# Patient Record
Sex: Female | Born: 1940 | Race: Black or African American | Hispanic: No | Marital: Single | Smoking: Former smoker
Health system: Southern US, Community
[De-identification: ages and names within clinical notes are randomized; demographics above are authoritative.]

## PROBLEM LIST (undated history)

## (undated) DIAGNOSIS — R51 Headache: Secondary | ICD-10-CM

## (undated) DIAGNOSIS — D649 Anemia, unspecified: Secondary | ICD-10-CM

## (undated) DIAGNOSIS — F419 Anxiety disorder, unspecified: Secondary | ICD-10-CM

## (undated) DIAGNOSIS — R519 Headache, unspecified: Secondary | ICD-10-CM

## (undated) DIAGNOSIS — R252 Cramp and spasm: Secondary | ICD-10-CM

## (undated) DIAGNOSIS — K2210 Ulcer of esophagus without bleeding: Secondary | ICD-10-CM

## (undated) DIAGNOSIS — I10 Essential (primary) hypertension: Secondary | ICD-10-CM

## (undated) DIAGNOSIS — E0590 Thyrotoxicosis, unspecified without thyrotoxic crisis or storm: Secondary | ICD-10-CM

## (undated) DIAGNOSIS — N281 Cyst of kidney, acquired: Secondary | ICD-10-CM

## (undated) DIAGNOSIS — M1990 Unspecified osteoarthritis, unspecified site: Secondary | ICD-10-CM

## (undated) DIAGNOSIS — K5790 Diverticulosis of intestine, part unspecified, without perforation or abscess without bleeding: Secondary | ICD-10-CM

## (undated) DIAGNOSIS — R0600 Dyspnea, unspecified: Secondary | ICD-10-CM

## (undated) DIAGNOSIS — T8859XA Other complications of anesthesia, initial encounter: Secondary | ICD-10-CM

## (undated) DIAGNOSIS — K219 Gastro-esophageal reflux disease without esophagitis: Secondary | ICD-10-CM

## (undated) DIAGNOSIS — E079 Disorder of thyroid, unspecified: Secondary | ICD-10-CM

## (undated) DIAGNOSIS — J189 Pneumonia, unspecified organism: Secondary | ICD-10-CM

## (undated) DIAGNOSIS — R011 Cardiac murmur, unspecified: Secondary | ICD-10-CM

## (undated) DIAGNOSIS — E039 Hypothyroidism, unspecified: Secondary | ICD-10-CM

## (undated) DIAGNOSIS — I493 Ventricular premature depolarization: Secondary | ICD-10-CM

## (undated) DIAGNOSIS — J479 Bronchiectasis, uncomplicated: Secondary | ICD-10-CM

## (undated) DIAGNOSIS — T4145XA Adverse effect of unspecified anesthetic, initial encounter: Secondary | ICD-10-CM

## (undated) DIAGNOSIS — I341 Nonrheumatic mitral (valve) prolapse: Secondary | ICD-10-CM

## (undated) DIAGNOSIS — Z8719 Personal history of other diseases of the digestive system: Secondary | ICD-10-CM

## (undated) HISTORY — PX: COLONOSCOPY WITH ESOPHAGOGASTRODUODENOSCOPY (EGD): SHX5779

## (undated) HISTORY — PX: BUNIONECTOMY: SHX129

## (undated) HISTORY — PX: APPENDECTOMY: SHX54

## (undated) HISTORY — PX: LAPAROSCOPY FOR ECTOPIC PREGNANCY: SUR765

## (undated) HISTORY — PX: ABDOMINAL HYSTERECTOMY: SHX81

## (undated) HISTORY — PX: EYE SURGERY: SHX253

---

## 1997-11-29 ENCOUNTER — Ambulatory Visit (HOSPITAL_COMMUNITY): Admission: RE | Admit: 1997-11-29 | Discharge: 1997-11-29 | Payer: Self-pay | Admitting: *Deleted

## 1999-05-24 ENCOUNTER — Encounter: Payer: Self-pay | Admitting: *Deleted

## 1999-05-24 ENCOUNTER — Ambulatory Visit (HOSPITAL_COMMUNITY): Admission: RE | Admit: 1999-05-24 | Discharge: 1999-05-24 | Payer: Self-pay | Admitting: *Deleted

## 1999-11-20 ENCOUNTER — Other Ambulatory Visit: Admission: RE | Admit: 1999-11-20 | Discharge: 1999-11-20 | Payer: Self-pay | Admitting: Urology

## 1999-12-09 ENCOUNTER — Encounter: Admission: RE | Admit: 1999-12-09 | Discharge: 1999-12-09 | Payer: Self-pay | Admitting: Urology

## 1999-12-09 ENCOUNTER — Encounter: Payer: Self-pay | Admitting: Urology

## 1999-12-13 ENCOUNTER — Encounter: Admission: RE | Admit: 1999-12-13 | Discharge: 1999-12-13 | Payer: Self-pay | Admitting: Urology

## 1999-12-13 ENCOUNTER — Encounter: Payer: Self-pay | Admitting: Urology

## 2000-03-13 ENCOUNTER — Encounter (INDEPENDENT_AMBULATORY_CARE_PROVIDER_SITE_OTHER): Payer: Self-pay | Admitting: Specialist

## 2000-03-13 ENCOUNTER — Ambulatory Visit (HOSPITAL_COMMUNITY): Admission: RE | Admit: 2000-03-13 | Discharge: 2000-03-13 | Payer: Self-pay | Admitting: Gastroenterology

## 2000-03-18 ENCOUNTER — Other Ambulatory Visit: Admission: RE | Admit: 2000-03-18 | Discharge: 2000-03-18 | Payer: Self-pay | Admitting: Urology

## 2000-05-06 ENCOUNTER — Encounter: Admission: RE | Admit: 2000-05-06 | Discharge: 2000-05-06 | Payer: Self-pay | Admitting: Internal Medicine

## 2000-05-06 ENCOUNTER — Encounter: Payer: Self-pay | Admitting: Internal Medicine

## 2000-05-18 ENCOUNTER — Encounter: Payer: Self-pay | Admitting: Internal Medicine

## 2000-05-18 ENCOUNTER — Encounter: Admission: RE | Admit: 2000-05-18 | Discharge: 2000-05-18 | Payer: Self-pay | Admitting: Internal Medicine

## 2000-06-03 ENCOUNTER — Encounter: Payer: Self-pay | Admitting: Internal Medicine

## 2000-06-03 ENCOUNTER — Encounter: Admission: RE | Admit: 2000-06-03 | Discharge: 2000-06-03 | Payer: Self-pay | Admitting: Internal Medicine

## 2001-04-02 ENCOUNTER — Other Ambulatory Visit: Admission: RE | Admit: 2001-04-02 | Discharge: 2001-04-02 | Payer: Self-pay | Admitting: Obstetrics and Gynecology

## 2001-04-05 ENCOUNTER — Encounter: Admission: RE | Admit: 2001-04-05 | Discharge: 2001-04-05 | Payer: Self-pay | Admitting: Obstetrics and Gynecology

## 2001-04-05 ENCOUNTER — Encounter: Payer: Self-pay | Admitting: Obstetrics and Gynecology

## 2001-06-08 ENCOUNTER — Encounter: Admission: RE | Admit: 2001-06-08 | Discharge: 2001-06-08 | Payer: Self-pay | Admitting: Obstetrics and Gynecology

## 2001-06-08 ENCOUNTER — Encounter: Payer: Self-pay | Admitting: Obstetrics and Gynecology

## 2002-06-10 ENCOUNTER — Ambulatory Visit (HOSPITAL_COMMUNITY): Admission: RE | Admit: 2002-06-10 | Discharge: 2002-06-10 | Payer: Self-pay | Admitting: Obstetrics and Gynecology

## 2002-06-10 ENCOUNTER — Encounter: Payer: Self-pay | Admitting: Obstetrics and Gynecology

## 2002-06-24 ENCOUNTER — Other Ambulatory Visit: Admission: RE | Admit: 2002-06-24 | Discharge: 2002-06-24 | Payer: Self-pay | Admitting: Obstetrics and Gynecology

## 2003-04-07 ENCOUNTER — Encounter: Admission: RE | Admit: 2003-04-07 | Discharge: 2003-04-07 | Payer: Self-pay | Admitting: Internal Medicine

## 2003-06-19 ENCOUNTER — Ambulatory Visit (HOSPITAL_COMMUNITY): Admission: RE | Admit: 2003-06-19 | Discharge: 2003-06-19 | Payer: Self-pay | Admitting: Obstetrics and Gynecology

## 2003-09-25 ENCOUNTER — Other Ambulatory Visit: Admission: RE | Admit: 2003-09-25 | Discharge: 2003-09-25 | Payer: Self-pay | Admitting: Obstetrics and Gynecology

## 2003-10-11 ENCOUNTER — Encounter: Admission: RE | Admit: 2003-10-11 | Discharge: 2003-10-11 | Payer: Self-pay | Admitting: Obstetrics and Gynecology

## 2004-06-25 ENCOUNTER — Ambulatory Visit (HOSPITAL_COMMUNITY): Admission: RE | Admit: 2004-06-25 | Discharge: 2004-06-25 | Payer: Self-pay | Admitting: Obstetrics and Gynecology

## 2004-10-23 ENCOUNTER — Ambulatory Visit: Admission: RE | Admit: 2004-10-23 | Discharge: 2004-10-23 | Payer: Self-pay | Admitting: Gynecology

## 2005-07-07 ENCOUNTER — Ambulatory Visit (HOSPITAL_COMMUNITY): Admission: RE | Admit: 2005-07-07 | Discharge: 2005-07-07 | Payer: Self-pay | Admitting: Obstetrics and Gynecology

## 2005-10-16 ENCOUNTER — Encounter: Admission: RE | Admit: 2005-10-16 | Discharge: 2005-10-16 | Payer: Self-pay | Admitting: Obstetrics and Gynecology

## 2006-07-08 ENCOUNTER — Ambulatory Visit (HOSPITAL_COMMUNITY): Admission: RE | Admit: 2006-07-08 | Discharge: 2006-07-08 | Payer: Self-pay | Admitting: Obstetrics and Gynecology

## 2006-12-09 ENCOUNTER — Emergency Department (HOSPITAL_COMMUNITY): Admission: EM | Admit: 2006-12-09 | Discharge: 2006-12-09 | Payer: Self-pay | Admitting: Family Medicine

## 2006-12-15 ENCOUNTER — Encounter: Admission: RE | Admit: 2006-12-15 | Discharge: 2006-12-15 | Payer: Self-pay | Admitting: Internal Medicine

## 2007-07-19 ENCOUNTER — Ambulatory Visit (HOSPITAL_COMMUNITY): Admission: RE | Admit: 2007-07-19 | Discharge: 2007-07-19 | Payer: Self-pay | Admitting: Obstetrics and Gynecology

## 2007-09-08 ENCOUNTER — Encounter: Admission: RE | Admit: 2007-09-08 | Discharge: 2007-09-08 | Payer: Self-pay | Admitting: Internal Medicine

## 2007-12-23 ENCOUNTER — Encounter: Admission: RE | Admit: 2007-12-23 | Discharge: 2007-12-23 | Payer: Self-pay | Admitting: Family Medicine

## 2008-07-19 ENCOUNTER — Encounter: Admission: RE | Admit: 2008-07-19 | Discharge: 2008-07-19 | Payer: Self-pay | Admitting: Obstetrics and Gynecology

## 2009-01-05 ENCOUNTER — Ambulatory Visit: Admission: RE | Admit: 2009-01-05 | Discharge: 2009-01-05 | Payer: Self-pay | Admitting: Gynecology

## 2009-01-09 ENCOUNTER — Encounter: Payer: Self-pay | Admitting: Pulmonary Disease

## 2009-01-09 ENCOUNTER — Ambulatory Visit (HOSPITAL_COMMUNITY): Admission: RE | Admit: 2009-01-09 | Discharge: 2009-01-09 | Payer: Self-pay | Admitting: Gynecology

## 2009-01-26 ENCOUNTER — Ambulatory Visit: Payer: Self-pay | Admitting: Pulmonary Disease

## 2009-01-26 DIAGNOSIS — I10 Essential (primary) hypertension: Secondary | ICD-10-CM | POA: Insufficient documentation

## 2009-01-26 DIAGNOSIS — J984 Other disorders of lung: Secondary | ICD-10-CM | POA: Insufficient documentation

## 2009-01-26 DIAGNOSIS — I059 Rheumatic mitral valve disease, unspecified: Secondary | ICD-10-CM | POA: Insufficient documentation

## 2009-01-26 DIAGNOSIS — J309 Allergic rhinitis, unspecified: Secondary | ICD-10-CM | POA: Insufficient documentation

## 2009-01-26 DIAGNOSIS — E039 Hypothyroidism, unspecified: Secondary | ICD-10-CM | POA: Insufficient documentation

## 2009-01-26 DIAGNOSIS — E0500 Thyrotoxicosis with diffuse goiter without thyrotoxic crisis or storm: Secondary | ICD-10-CM | POA: Insufficient documentation

## 2009-01-26 DIAGNOSIS — R011 Cardiac murmur, unspecified: Secondary | ICD-10-CM | POA: Insufficient documentation

## 2009-02-11 ENCOUNTER — Ambulatory Visit (HOSPITAL_COMMUNITY): Admission: RE | Admit: 2009-02-11 | Discharge: 2009-02-11 | Payer: Self-pay | Admitting: Urology

## 2009-06-04 ENCOUNTER — Telehealth: Payer: Self-pay | Admitting: Pulmonary Disease

## 2009-06-07 ENCOUNTER — Telehealth: Payer: Self-pay | Admitting: Pulmonary Disease

## 2009-06-12 ENCOUNTER — Ambulatory Visit (HOSPITAL_COMMUNITY)
Admission: RE | Admit: 2009-06-12 | Discharge: 2009-06-12 | Payer: Self-pay | Source: Home / Self Care | Admitting: Urology

## 2009-06-13 ENCOUNTER — Ambulatory Visit: Payer: Self-pay | Admitting: Cardiovascular Disease

## 2009-07-23 ENCOUNTER — Ambulatory Visit (HOSPITAL_COMMUNITY): Admission: RE | Admit: 2009-07-23 | Discharge: 2009-07-23 | Payer: Self-pay | Admitting: Obstetrics and Gynecology

## 2009-12-14 ENCOUNTER — Ambulatory Visit (HOSPITAL_COMMUNITY): Admission: RE | Admit: 2009-12-14 | Discharge: 2009-12-14 | Payer: Self-pay | Admitting: Urology

## 2010-07-01 ENCOUNTER — Encounter: Payer: Self-pay | Admitting: Urology

## 2010-07-01 ENCOUNTER — Encounter: Payer: Self-pay | Admitting: Gynecology

## 2010-07-03 ENCOUNTER — Encounter: Payer: Self-pay | Admitting: Pulmonary Disease

## 2010-07-04 ENCOUNTER — Other Ambulatory Visit (HOSPITAL_COMMUNITY): Payer: Self-pay | Admitting: Obstetrics and Gynecology

## 2010-07-04 DIAGNOSIS — Z1231 Encounter for screening mammogram for malignant neoplasm of breast: Secondary | ICD-10-CM

## 2010-07-04 DIAGNOSIS — Z139 Encounter for screening, unspecified: Secondary | ICD-10-CM

## 2010-07-09 ENCOUNTER — Ambulatory Visit: Payer: Self-pay | Admitting: Cardiology

## 2010-07-11 NOTE — Miscellaneous (Signed)
Summary: Orders Update   Clinical Lists Changes  Orders: Added new Referral order of Radiology Referral (Radiology) - Signed 

## 2010-07-24 ENCOUNTER — Ambulatory Visit (HOSPITAL_COMMUNITY): Admission: RE | Admit: 2010-07-24 | Payer: Medicare Other | Source: Ambulatory Visit

## 2010-08-25 LAB — CREATININE, SERUM
Creatinine, Ser: 0.82 mg/dL (ref 0.4–1.2)
GFR calc non Af Amer: >60 mL/min (ref >60)

## 2010-09-05 ENCOUNTER — Ambulatory Visit (HOSPITAL_COMMUNITY)
Admission: RE | Admit: 2010-09-05 | Discharge: 2010-09-05 | Disposition: A | Discharge status: Home / Self Care | Payer: Medicare Other | Source: Ambulatory Visit | Attending: Obstetrics and Gynecology | Admitting: Obstetrics and Gynecology

## 2010-09-05 DIAGNOSIS — Z1231 Encounter for screening mammogram for malignant neoplasm of breast: Secondary | ICD-10-CM

## 2010-09-15 LAB — BUN: BUN: 25 mg/dL — ABNORMAL HIGH (ref 6–23)

## 2010-10-22 NOTE — Consult Note (Signed)
Amanda Bender, KERPER NO.:  [id number]   MEDICAL RECORD NO.:  [id number]          PATIENT TYPE:  OUT   LOCATION:  GYN                          FACILITY:  [facility]   PHYSICIAN:  De Blanch, M.D.DATE OF BIRTH:  04-04-1941   DATE OF CONSULTATION:  DATE OF DISCHARGE:                                 CONSULTATION   REFERRING PHYSICIAN:  Maxie Better, M.D.   CHIEF COMPLAINT:  Rising CA-125.   HISTORY OF PRESENT ILLNESS:  A 70 year old [place] American female seen  in consultation at the request Dr. Cherly Hensen regarding a rising CA-125  value.  I initially saw the patient in May 2006 at which time she had a  small ovarian or paraovarian cyst and a great-grandmother with ovarian  cancer.  Concern regarding this cyst at that time was as to whether it  might be a malignancy.  I recommended the patient have a salpingo-  oophorectomy laparoscopically, but apparently that was not performed.  However, subsequently the patient has had CA-125 values performed in  monitoring.  Following values are available to me today.   May 2007 13 units mL; March 2008 15 units mL; May 2009 26.1 units ml;  August 2009 19 units mL May 2010 31 units mL.   The patient has had an ultrasound of the right ovary on Oct 19, 2008  which showed a small cystic area in the upper aspect the ovary measuring  0.8 x 1x 0.8 cm and there is some calcification.  This is essentially  unchanged from 4 years ago.   From a gynecologic point of view the patient has previously had a  hysterectomy and left salpingo-oophorectomy.   In considering other possibilities of a rising CA-125 we noted that  inflammatory disease can result in a falsely elevated CA-125.  The  patient then tells me that 2 years ago she had an onset of chills and  right ears and a rash which ultimately involved both legs and a fever  103 degrees well and associated ankle swelling.  She apparently  developed an abscess around her  knee which had to be drained and was a  staph infection.   Again this year the patient had a similar set of circumstances arise,  this time starting in the right leg but ultimately involving both legs.  She had sloughing of skin and required aspiration of her right ankle.  She was treated with Keflex at that time.   The patient's other symptoms are that she has right abdominal pain with  an onset of approximately a month ago.  This can be severe but is  intermittent.  She also has some low back pain.  Finally, she has had  bouts of sinusitis and has developed irregular bowel movements over the  past several months where previously she was very regular having 1 bowel  movement today.  She denies any blood in the stool.   PAST MEDICAL HISTORY:  Medical illnesses, hypertension, hypothyroidism  (Graves disease treated with iodine 125).   PAST SURGICAL HISTORY:  A hysterectomy and left salpingo-oophorectomy,  ectopic pregnancy, bunionectomy, ankle surgery.   FAMILY HISTORY:  The patient's great-grandmother had  ovarian cancer.  She has a number of men on the maternal side of the family with prostate  cancer.  It is also noted the patient's 12 year old daughter developed a  fulminant hepatic failure of unknown etiology and succumbed from that  disease.   CURRENT MEDICATIONS:  Synthroid, Avapro, Toprol, and Fosamax.  The  patient takes baby aspirin on occasion.   OBSTETRICAL HISTORY:  Gravida 2.   DRUG ALLERGIES:  SULFA (rash).   REVIEW OF SYSTEMS:  Negative except for symptoms noted above.   PHYSICAL EXAM:  VITAL SIGNS:  Weight 177 pounds, blood pressure 130/80.  GENERAL:  The patient is a healthy African American female in no acute  distress.  HEENT:  Negative.  NECK:  Supple without thyromegaly.  There is no supraclavicular or  inguinal adenopathy.  MUSCULOSKELETAL:  There is no CVA tenderness.  The patient points to her  right flank lower than the rib cage as a source of the  severe pain, 1  episode of which occurred while she was on the exam table.  GI:  The abdomen itself is soft and nontender.  No masses, organomegaly,  ascites or hernias noted.  PELVIC:  EG, BUS, vagina, bladder, urethra are normal.  Cervix and  uterus are surgically absent.  Adnexa without masses.  Rectovaginal exam  confirms.   Records from Dr. Maxie Better' office were reviewed and summarized  above.   IMPRESSION:  Rising cancer antigen 125 of questionable etiology.   PLAN:  I had a lengthy discussion with the patient regarding the  differential diagnosis.  I emphasized that CA-125 is nonspecific and  could represent a variety of benign or malignant conditions.  Because of  the possibility that this CA-125 could represent an occult malignancy I  would suggest that she have a CT scan of the chest, abdomen and pelvis  to assess for the possibility of an occult malignancy.  We will schedule  a CT scan to be done in the very near future.   If the CT scan is negative that I think 1 must consider medical problems  and the 2 episodes of a rash in her legs are some concern to me.  I do  not have records from Dr. Mathews Robinsons office and would suggest that the  patient return to Dr.  Renae Gloss for further evaluation to rule out rheumatologic conditions,  autoimmune conditions, or other sources of inflammation which might  cause an elevation of the CA-125.  I would also continue to monitor the  CA-125 at approximately 10-month intervals until this issue is resolved.      De Blanch, M.D.  Electronically Signed     DC/MEDQ  D:  01/05/2009  T:  01/05/2009  Job:  [id number]   cc:   Maxie Better, M.D.  Fax: [phone]   Merlene Laughter. Renae Gloss, M.D.  Fax: [phone]   Hermelinda Medicus, M.D.  Fax: [phone]   [name], R.N.  [address]  [place], [place] [address]

## 2010-10-25 NOTE — Consult Note (Signed)
Amanda Bender  [name], PLANCK NO.:  [id number]   MEDICAL RECORD NO.:  [id number]          PATIENT TYPE:  OUT   LOCATION:  GYN                          FACILITY:  [facility]   PHYSICIAN:  De Blanch, M.D.DATE OF BIRTH:  22-Feb-1941   DATE OF CONSULTATION:  DATE OF DISCHARGE:                                   CONSULTATION   A 70 year old African-American female seen in consultation at the request of  Dr. Maxie Better regarding newly diagnosed adnexal mass.   The patient had an ultrasound performed in Dr. Cherly Hensen' office on April 21st  because of a family history of ovarian cancer.  In the year previously, the  ultrasound was normal; however, the most recent scan shows a hyperechoic  area on the right ovary, measuring 0.58 x 0.38 x 0.63 cm, as well as a  cystic tubular structure adjacent to the right ovary, measuring 2.2 x 1.2 x  1.7 cm.  There is no color flow in either area.  The tubular structure is  thought to be possibly a hydrosalpinx.   The patient does note some back pain, right lower quadrant pain and gas and  bloating.  She is uncertain as to whether this might be associated with the  mass or not.   FAMILY HISTORY:  One of a great grandmother with ovarian cancer.  She has an  uncle and a grandfather with prostate cancer.   GYNECOLOGIC HISTORY:  The patient had an ectopic pregnancy removed in 1989.  She has previously had a hysterectomy and a left salpingo-oophorectomy.   PAST MEDICAL HISTORY:  Medical illnesses:  1.  Hypertension.  2.  Hypothyroidism, following treatment for Graves disease with iodine 125.   PAST SURGICAL HISTORY:  1.  Hysterectomy, with left salpingo-oophorectomy.  2.  Ectopic pregnancy.  3.  Bunionectomy.  4.  Additional foot surgery.   CURRENT MEDICATIONS:  Synthroid, Avalide, Toprol and Fosamax.   DRUG ALLERGIES:  Sulfa (rash).   OBSTETRIC HISTORY:  Gravida 2.  One child died of hepatitis.   REVIEW OF SYSTEMS:   Essentially negative except those symptoms noted above.  She also claims to have stress incontinence and urge incontinence.   PHYSICAL EXAMINATION:  VITAL SIGNS:  Weight 167 pounds.  Height 5-foot-3.  Blood pressure 134/78, pulse 76.  GENERAL:  The patient is a pleasant black female in no acute distress.  HEENT:  Negative.  NECK:  Supple without thyromegaly.  LYMPHATIC:  There is no supraclavicular or inguinal adenopathy.  ABDOMEN:  Soft and nontender.  No mass, organomegaly, ascites or hernia is  noted.  PELVIC:  Reveals BUS, vagina body and urethra are normal, although she does  have a first-degree cystocele.  No lesions are noted.  Bimanual and  rectovaginal exams reveal no masses, induration or nodularity.  EXTREMITIES:  Lower extremities without edema or varicosities.   IMPRESSION:  Small, simple adnexal cyst with ovary showing a small  hypoechoic area.  I believe these are probably benign findings, but given  the patient's additional symptoms and concern regarding the true nature of  this lesion, I would recommend that they be removed.  I think it would be  perfectly reasonable to proceed laparoscopically.  The patient will return  to the care of Dr. Cherly Hensen, and I anticipate that Dr. Carey Bullocks would be  available should GYN oncology consultation be necessary.      DC/MEDQ  D:  10/23/2004  T:  10/23/2004  Job:  [id number]   cc:   Maxie Better, M.D.  [address]  [place]  [place] [address]  Fax: [phone]   [name], R.N.  [address]  [place], [place] [address]

## 2010-10-25 NOTE — Procedures (Signed)
[place]  Patient:    Amanda Bender, Amanda Bender               MRN: [id number] Proc. Date: 03/13/00 Adm. Date:  [id number] Disc. Date: [id number] Attending:  Charna Elizabeth CC:         Merlene Laughter. Renae Gloss, M.D.  Deniece Ree, M.D.   Procedure Report  DATE OF BIRTH:  Oct 17, 1940  PROCEDURE:  Colonoscopy.  ENDOSCOPIST:  Anselmo Rod, M.D.  INSTRUMENTS USED:  Olympus video colonoscope.  INDICATIONS:  Change in bowel habits with blood in stools in a 70 year old black female.  Rule out colonic polyps, masses, hemorrhoids, etc.  PREPROCEDURE PREPARATION:  Informed consent was procured from the patient. The patient was fasted for eight hours prior to the procedure and prepped with a bottle of magnesium citrate and a gallon of NuLytely the night prior to the procedure.  PREPROCEDURE PHYSICAL EXAMINATION:  VITAL SIGNS:  The patient had stable vital signs.  NECK:  Supple.  CHEST:  Clear to auscultation, S1, S2 regular.  ABDOMEN:  Soft with normal abdominal bowel sounds.  DESCRIPTION OF PROCEDURE:  The patient was placed in the left lateral decubitus position and sedated with 60 mg of Demerol and 6 mg of Versed intravenously.  Once the patient was adequately sedated and maintained on low-flow oxygen and continuous cardiac monitoring, the Olympus video colonoscope was advanced from the rectum to the cecum without difficulty. There was some residue still in the colon, but this was suctioned.  There was patchy erythema seen in the right colon just distal to the cecum.  This area was biopsied for pathology.  The appendiceal orifice and the ileocecal valve were clearly visualized.  No other abnormalities were seen.  The patient tolerated the procedure well without complications.  IMPRESSION:  Essentially normal colonoscopy except for a small patch of erythema in the area of the right colon just at the cecum.  Biopsy  for pathology.  RECOMMENDATIONS: 1. The patient has been advised to increase fluids and fiber in her diet. 2. Librax 1 p.o. q.6-8h. p.r.n. for spasm. 3. The patient will follow up on a p.r.n. basis. DD:  03/13/00 TD:  03/15/00 Job: [id number]

## 2010-10-25 NOTE — Procedures (Signed)
[place]  Patient:    Amanda Bender, Amanda Bender               MRN: [id number] Proc. Date: 03/13/00 Adm. Date:  [id number] Disc. Date: [id number] Attending:  Charna Elizabeth                           Procedure Report  NO DICTATION. DD:  03/13/00 TD:  03/15/00 Job: [id number]

## 2010-12-27 ENCOUNTER — Other Ambulatory Visit (HOSPITAL_COMMUNITY): Payer: Self-pay | Admitting: Urology

## 2010-12-27 DIAGNOSIS — D49519 Neoplasm of unspecified behavior of unspecified kidney: Secondary | ICD-10-CM

## 2011-01-02 ENCOUNTER — Ambulatory Visit (HOSPITAL_COMMUNITY)
Admission: RE | Admit: 2011-01-02 | Discharge: 2011-01-02 | Disposition: A | Discharge status: Home / Self Care | Payer: Medicare Other | Source: Ambulatory Visit | Attending: Urology | Admitting: Urology

## 2011-01-02 DIAGNOSIS — N281 Cyst of kidney, acquired: Secondary | ICD-10-CM | POA: Insufficient documentation

## 2011-01-02 DIAGNOSIS — D49519 Neoplasm of unspecified behavior of unspecified kidney: Secondary | ICD-10-CM

## 2011-01-02 MED ORDER — GADOBENATE DIMEGLUMINE 529 MG/ML IV SOLN
20.0000 mL | Freq: Once | INTRAVENOUS | Status: AC | PRN
  Administered 2011-01-02: 20 mL via INTRAVENOUS

## 2011-03-25 LAB — CBC
HCT: 40.5
Hemoglobin: 13.1
MCHC: 32.4
MCV: 78
Platelets: 262
RBC: 5.2 — ABNORMAL HIGH
RDW: 14.1 — ABNORMAL HIGH
WBC: 16.7 — ABNORMAL HIGH

## 2011-03-25 LAB — I-STAT 8, (EC8 V) (CONVERTED LAB)
Acid-Base Excess: 6 — ABNORMAL HIGH
BUN: 24 — ABNORMAL HIGH
Bicarbonate: 22.1
Chloride: 109
Glucose, Bld: 119 — ABNORMAL HIGH
HCT: 43
Hemoglobin: 14.6
Operator id: 239701
Potassium: 4.2
Sodium: 136
TCO2: 23
pCO2, Ven: 15.9 — ABNORMAL LOW
pH, Ven: 7.752

## 2011-03-25 LAB — CULTURE, BLOOD (ROUTINE X 2)
Culture: NO GROWTH
Culture: NO GROWTH

## 2011-03-25 LAB — POCT URINALYSIS DIP (DEVICE)
Bilirubin Urine: NEGATIVE
Glucose, UA: NEGATIVE
Nitrite: NEGATIVE
Operator id: 239701
Protein, ur: NEGATIVE
Specific Gravity, Urine: 1.015
Urobilinogen, UA: 0.2
pH: 6

## 2011-03-25 LAB — POCT I-STAT CREATININE
Creatinine, Ser: 1.1
Operator id: 239701

## 2011-03-25 LAB — HEPATIC FUNCTION PANEL
AST: 19
Bilirubin, Direct: 0.1
Indirect Bilirubin: 1 — ABNORMAL HIGH
Total Bilirubin: 1.1

## 2011-03-25 LAB — ROCKY MTN SPOTTED FVR AB, IGM-BLOOD: RMSF IgM: 0.51

## 2011-03-25 LAB — LIPASE, BLOOD: Lipase: 22

## 2011-03-25 LAB — ROCKY MTN SPOTTED FVR AB, IGG-BLOOD: RMSF IgG: 0.14 {ISR}

## 2011-07-09 ENCOUNTER — Other Ambulatory Visit: Payer: Self-pay

## 2011-07-09 ENCOUNTER — Emergency Department (INDEPENDENT_AMBULATORY_CARE_PROVIDER_SITE_OTHER): Payer: Medicare Other

## 2011-07-09 ENCOUNTER — Emergency Department (HOSPITAL_BASED_OUTPATIENT_CLINIC_OR_DEPARTMENT_OTHER)
Admission: EM | Admit: 2011-07-09 | Discharge: 2011-07-09 | Disposition: A | Discharge status: Home / Self Care | Payer: Medicare Other | Attending: Emergency Medicine | Admitting: Emergency Medicine

## 2011-07-09 ENCOUNTER — Encounter (HOSPITAL_BASED_OUTPATIENT_CLINIC_OR_DEPARTMENT_OTHER): Payer: Self-pay | Admitting: *Deleted

## 2011-07-09 DIAGNOSIS — R062 Wheezing: Secondary | ICD-10-CM

## 2011-07-09 DIAGNOSIS — IMO0001 Reserved for inherently not codable concepts without codable children: Secondary | ICD-10-CM | POA: Insufficient documentation

## 2011-07-09 DIAGNOSIS — R05 Cough: Secondary | ICD-10-CM | POA: Insufficient documentation

## 2011-07-09 DIAGNOSIS — R52 Pain, unspecified: Secondary | ICD-10-CM

## 2011-07-09 DIAGNOSIS — I10 Essential (primary) hypertension: Secondary | ICD-10-CM | POA: Insufficient documentation

## 2011-07-09 DIAGNOSIS — R079 Chest pain, unspecified: Secondary | ICD-10-CM

## 2011-07-09 DIAGNOSIS — R059 Cough, unspecified: Secondary | ICD-10-CM

## 2011-07-09 DIAGNOSIS — J069 Acute upper respiratory infection, unspecified: Secondary | ICD-10-CM

## 2011-07-09 DIAGNOSIS — E079 Disorder of thyroid, unspecified: Secondary | ICD-10-CM | POA: Insufficient documentation

## 2011-07-09 HISTORY — DX: Essential (primary) hypertension: I10

## 2011-07-09 HISTORY — DX: Nonrheumatic mitral (valve) prolapse: I34.1

## 2011-07-09 HISTORY — DX: Disorder of thyroid, unspecified: E07.9

## 2011-07-09 LAB — DIFFERENTIAL
Basophils Absolute: 0 10*3/uL (ref 0.0–0.1)
Eosinophils Relative: 0 % (ref 0–5)
Lymphocytes Relative: 22 % (ref 12–46)
Lymphs Abs: 1.7 10*3/uL (ref 0.7–4.0)
Neutro Abs: 5.4 10*3/uL (ref 1.7–7.7)
Neutrophils Relative %: 69 % (ref 43–77)

## 2011-07-09 LAB — CARDIAC PANEL(CRET KIN+CKTOT+MB+TROPI)
CK, MB: 9 ng/mL (ref 0.3–4.0)
Relative Index: INVALID (ref 0.0–2.5)
Total CK: 74 U/L (ref 7–177)
Troponin I: <0.3 ng/mL (ref <0.30)

## 2011-07-09 LAB — CBC
MCV: 76.2 fL — ABNORMAL LOW (ref 78.0–100.0)
Platelets: 259 10*3/uL (ref 150–400)
RBC: 5.26 MIL/uL — ABNORMAL HIGH (ref 3.87–5.11)
RDW: 15.5 % (ref 11.5–15.5)
WBC: 7.9 10*3/uL (ref 4.0–10.5)

## 2011-07-09 MED ORDER — HYDROCOD POLST-CHLORPHEN POLST 10-8 MG/5ML PO LQCR: 5.0000 mL | Freq: Two times a day (BID) | ORAL | Status: DC

## 2011-07-09 MED ORDER — ALBUTEROL SULFATE (5 MG/ML) 0.5% IN NEBU
5.0000 mg | INHALATION_SOLUTION | Freq: Once | RESPIRATORY_TRACT | Status: AC
  Administered 2011-07-09: 5 mg via RESPIRATORY_TRACT
  Filled 2011-07-09: qty 1

## 2011-07-09 NOTE — ED Notes (Signed)
C/o cough, malaise, fever, and congestion since Sunday

## 2011-07-09 NOTE — ED Provider Notes (Signed)
Complains of cough nasal congestion chest pain substernal worse with coughing onset 3 days ago treated with cloracetin-H. without relief. Denies fever no vomiting no sweatiness no nausea no other associated symptoms On exam no respiratory distress speaks in paragraphs HEENT exam extremities moist pink nasal congestion present neck supple trachea midline lungs clear to auscultation coughing frequently chest is tender over sternum abdomen mildly obese nontender extremities without edema;. Strongly doubt cardiac etiology of pain and symptoms normal EKG highly atypical symptoms symptoms are consistent with viral illness  Doug Sou, MD 07/09/11 2146

## 2011-07-09 NOTE — ED Provider Notes (Signed)
History     CSN: [id number]  Arrival date & time 07/09/11  2019   First MD Initiated Contact with Patient 07/09/11 2033      Chief Complaint  Patient presents with  . Cough  . Generalized Body Aches    (Consider location/radiation/quality/duration/timing/severity/associated sxs/prior treatment) Patient is a 71 y.o. female presenting with cough.  Cough This is a new problem. The current episode started more than 2 days ago. The problem occurs every few hours. The problem has been gradually worsening. The cough is productive of sputum. There has been no fever. Associated symptoms include chest pain, chills, rhinorrhea, sore throat and myalgias. She has tried decongestants and mist for the symptoms. The treatment provided mild relief. She is not a smoker.    Past Medical History  Diagnosis Date  . Hypertension   . Thyroid disease   . Mitral prolapse     Past Surgical History  Procedure Date  . Abdominal hysterectomy   . Laparoscopy for ectopic pregnancy   . Bunionectomy     History reviewed. No pertinent family history.  History  Substance Use Topics  . Smoking status: Never Smoker   . Smokeless tobacco: Not on file  . Alcohol Use: No    OB History    Grav Para Term Preterm Abortions TAB SAB Ect Mult Living                  Review of Systems  Constitutional: Positive for chills.  HENT: Positive for sore throat and rhinorrhea.   Respiratory: Positive for cough.   Cardiovascular: Positive for chest pain.  Musculoskeletal: Positive for myalgias.  All other systems reviewed and are negative.    Allergies  Iohexol and Sulfonamide derivatives  Home Medications  No current outpatient prescriptions on file.  BP 140/83  Pulse 81  Temp(Src) 99.8 F (37.7 C) (Oral)  Resp 18  Ht 5\' 2"  (1.575 m)  Wt 180 lb (81.647 kg)  BMI 32.92 kg/m2  SpO2 98%  Physical Exam  Nursing note and vitals reviewed. Constitutional: She is oriented to person, place, and time.  She appears well-developed and well-nourished.  HENT:  Head: Normocephalic.  Mouth/Throat: Oropharynx is clear and moist.  Eyes: Pupils are equal, round, and reactive to light.  Neck: Normal range of motion. Neck supple.  Cardiovascular: Normal rate and regular rhythm.   Pulmonary/Chest: Effort normal. She exhibits tenderness.  Abdominal: Soft. Bowel sounds are normal.  Musculoskeletal: Normal range of motion.  Neurological: She is alert and oriented to person, place, and time.  Skin: Skin is warm and dry.  Psychiatric: She has a normal mood and affect.    ED Course  Procedures (including critical care time)  Date: 07/09/2011  Rate: 78  Rhythm: normal sinus rhythm  QRS Axis: normal  Intervals: normal  ST/T Wave abnormalities: normal  Conduction Disutrbances:none  Narrative Interpretation: Normal sinus rhythm  Old EKG Reviewed: unchanged    Labs Reviewed  CBC - Abnormal; Notable for the following:    RBC 5.26 (*)    MCV 76.2 (*)    MCH 25.3 (*)    All other components within normal limits  CARDIAC PANEL(CRET KIN+CKTOT+MB+TROPI) - Abnormal; Notable for the following:    CK, MB 9.0 (*)    All other components within normal limits  DIFFERENTIAL   Dg Chest 2 View  07/09/2011  *RADIOLOGY REPORT*  Clinical Data: Mid chest pain, wheezing, and cough for 3 days.  CHEST - 2 VIEW  Comparison: 12/09/2006  Findings: Borderline heart size with normal pulmonary vascularity. No focal airspace consolidation in the lungs.  No blunting of costophrenic angles.  No pneumothorax.  Scattered calcified granulomas in the right lung.  Mild degenerative changes in the thoracic spine. Degenerative changes in the right shoulder with suggestion of small calcification and narrowing of the sub acromial space suggesting calcific tendonitis.  Stable appearance of the chest since previous study.  IMPRESSION: No evidence of active pulmonary disease.  Original Report Authenticated By: Marlon Pel, M.D.      No diagnosis found.   1. Viral illness MDM          Jimmye Norman, NP 07/09/11 2228

## 2011-07-09 NOTE — ED Provider Notes (Signed)
Medical screening examination/treatment/procedure(s) were conducted as a shared visit with non-physician practitioner(s) and myself.  I personally evaluated the patient during the encounter  Doug Sou, MD 07/09/11 2350

## 2011-08-26 ENCOUNTER — Ambulatory Visit (INDEPENDENT_AMBULATORY_CARE_PROVIDER_SITE_OTHER): Payer: Medicare Other | Admitting: Pulmonary Disease

## 2011-08-26 ENCOUNTER — Encounter: Payer: Self-pay | Admitting: Pulmonary Disease

## 2011-08-26 ENCOUNTER — Ambulatory Visit (INDEPENDENT_AMBULATORY_CARE_PROVIDER_SITE_OTHER)
Admission: RE | Admit: 2011-08-26 | Discharge: 2011-08-26 | Disposition: A | Discharge status: Home / Self Care | Payer: Medicare Other | Source: Ambulatory Visit | Attending: Pulmonary Disease | Admitting: Pulmonary Disease

## 2011-08-26 VITALS — BP 112/68 | HR 71 | Temp 98.2°F | Ht 62.5 in | Wt 191.4 lb

## 2011-08-26 DIAGNOSIS — R059 Cough, unspecified: Secondary | ICD-10-CM | POA: Insufficient documentation

## 2011-08-26 DIAGNOSIS — J3489 Other specified disorders of nose and nasal sinuses: Secondary | ICD-10-CM

## 2011-08-26 DIAGNOSIS — R0981 Nasal congestion: Secondary | ICD-10-CM

## 2011-08-26 DIAGNOSIS — R05 Cough: Secondary | ICD-10-CM

## 2011-08-26 MED ORDER — BENZONATATE 100 MG PO CAPS: 200.0000 mg | ORAL_CAPSULE | Freq: Four times a day (QID) | ORAL | Status: AC | PRN

## 2011-08-26 NOTE — Progress Notes (Signed)
  Subjective:    Patient ID: Amanda Bender, female    DOB: May 14, 1941, 71 y.o.   MRN: [id number]  HPI The patient comes in today for an acute sick visit.  She has been seen in the past for pulmonary nodules, but has not been seen since 2010.  She gives a one to two-month history of cough, persistent sinus congestion with purulence, as well as some mucus from her upper chest.  She is not having chest congestion or wheezing.  She has been treated with multiple rounds of antibiotics and one course of prednisone, with the last being one week ago.  She did see some improvement with the Z-Pak and prednisone, but is still having persistent symptoms.  She is having a lot of sinus congestion and a barking cough.  She is also describing chest burning that is suspicious for reflux.   Review of Systems  Constitutional: Positive for fever. Negative for unexpected weight change.  HENT: Positive for congestion, rhinorrhea and postnasal drip. Negative for ear pain, nosebleeds, sore throat, sneezing, trouble swallowing, dental problem and sinus pressure.   Eyes: Negative for redness and itching.  Respiratory: Positive for cough and shortness of breath. Negative for chest tightness and wheezing.   Cardiovascular: Negative for palpitations and leg swelling.  Gastrointestinal: Negative for nausea and vomiting.  Genitourinary: Negative for dysuria.  Musculoskeletal: Negative for joint swelling.  Skin: Negative for rash.  Neurological: Negative for headaches.  Hematological: Does not bruise/bleed easily.  Psychiatric/Behavioral: Negative for dysphoric mood. The patient is not nervous/anxious.        Objective:   Physical Exam Obese female in no acute distress Nose with erythematous mucosa and edema, no purulence Oropharynx without lesions or exudates Chest totally clear to auscultation, no wheezing Cardiac exam with regular rate and rhythm Lower extremities with ankle edema, no cyanosis Alert and  oriented, moves all 4 extremities.       Assessment & Plan:

## 2011-08-26 NOTE — Patient Instructions (Addendum)
Will schedule for scan of your sinuses to evaluate for sinusitis.  I will call you with results.  Use neilmed sinus rinses am and pm until you are better. Minimize voice use, no throat clearing.  Use hard candy during day to bathe back of throat Take tessalon pearls 2 every 6 hrs if needed for cough Take prilosec over the counter 2 every am for next few weeks.

## 2011-08-26 NOTE — Assessment & Plan Note (Signed)
The patient's cough sounds more upper airway in origin than lower.  I suspect that postnasal drip and reflux are contributing to this, as well as a cyclical mechanism

## 2011-08-26 NOTE — Assessment & Plan Note (Signed)
The patient is having persistent sinus congestion and discomfort despite being treated with antibiotics and prednisone.  She also has a nasally voice today as well.  Because of a poor response to antibiotics, will proceed with a limited CT of her sinuses for evaluation.

## 2011-08-27 ENCOUNTER — Telehealth: Payer: Self-pay | Admitting: Pulmonary Disease

## 2011-08-27 NOTE — Telephone Encounter (Signed)
Per 3.19.13 Sinus CT:  Result Notes     Notes Recorded by Tylene Fantasia. Reynolds, LPN on 1/61/0960 at 10:13 AM [facility] for pt TCB ------  Notes Recorded by Barbaraann Share, MD on 08/26/2011 at 5:18 PM Please let pt know that her scan of sinuses is clear for sinusitis. This doesn't exclude rhinitis (which is congestion due to allergy). I would like her to continue sinus rinses, take chlorpheniramine 8mg  over the counter at bedtime, and to continue the other recommendations. Please call me in 2 weeks with how things are going.  ---- Called spoke with patient, advised of CT results as stated by [facility] above.  Pt verbalized her understanding, wrote down the recs verbally given to her and read them back to me.  Will call in a couple of weeks to update KC on her symptoms.  Nothing further needed at this time - advised pt to call with any questions/concerns.

## 2011-10-24 ENCOUNTER — Other Ambulatory Visit (HOSPITAL_COMMUNITY): Payer: Self-pay | Admitting: Obstetrics and Gynecology

## 2011-10-24 DIAGNOSIS — Z1231 Encounter for screening mammogram for malignant neoplasm of breast: Secondary | ICD-10-CM

## 2011-10-28 ENCOUNTER — Ambulatory Visit (HOSPITAL_COMMUNITY)
Admission: RE | Admit: 2011-10-28 | Discharge: 2011-10-28 | Disposition: A | Discharge status: Home / Self Care | Payer: Medicare Other | Source: Ambulatory Visit | Attending: Obstetrics and Gynecology | Admitting: Obstetrics and Gynecology

## 2011-10-28 DIAGNOSIS — Z1231 Encounter for screening mammogram for malignant neoplasm of breast: Secondary | ICD-10-CM | POA: Insufficient documentation

## 2012-01-19 ENCOUNTER — Other Ambulatory Visit (HOSPITAL_COMMUNITY): Payer: Self-pay | Admitting: Urology

## 2012-01-19 DIAGNOSIS — D49519 Neoplasm of unspecified behavior of unspecified kidney: Secondary | ICD-10-CM

## 2012-02-04 ENCOUNTER — Ambulatory Visit (HOSPITAL_COMMUNITY)
Admission: RE | Admit: 2012-02-04 | Discharge: 2012-02-04 | Disposition: A | Discharge status: Home / Self Care | Payer: Medicare Other | Source: Ambulatory Visit | Attending: Urology | Admitting: Urology

## 2012-02-04 DIAGNOSIS — Q619 Cystic kidney disease, unspecified: Secondary | ICD-10-CM | POA: Insufficient documentation

## 2012-02-04 DIAGNOSIS — D49519 Neoplasm of unspecified behavior of unspecified kidney: Secondary | ICD-10-CM

## 2012-02-04 MED ORDER — GADOBENATE DIMEGLUMINE 529 MG/ML IV SOLN
18.0000 mL | Freq: Once | INTRAVENOUS | Status: AC | PRN
  Administered 2012-02-04: 18 mL via INTRAVENOUS

## 2012-02-10 ENCOUNTER — Other Ambulatory Visit: Payer: Self-pay | Admitting: Obstetrics and Gynecology

## 2012-02-10 DIAGNOSIS — M8580 Other specified disorders of bone density and structure, unspecified site: Secondary | ICD-10-CM

## 2012-11-03 ENCOUNTER — Other Ambulatory Visit (HOSPITAL_COMMUNITY): Payer: Self-pay | Admitting: Obstetrics and Gynecology

## 2012-11-03 DIAGNOSIS — Z1231 Encounter for screening mammogram for malignant neoplasm of breast: Secondary | ICD-10-CM

## 2012-11-10 ENCOUNTER — Ambulatory Visit (HOSPITAL_COMMUNITY)
Admission: RE | Admit: 2012-11-10 | Discharge: 2012-11-10 | Disposition: A | Discharge status: Home / Self Care | Payer: Medicare Other | Source: Ambulatory Visit | Attending: Obstetrics and Gynecology | Admitting: Obstetrics and Gynecology

## 2012-11-10 DIAGNOSIS — Z1231 Encounter for screening mammogram for malignant neoplasm of breast: Secondary | ICD-10-CM | POA: Insufficient documentation

## 2012-11-25 ENCOUNTER — Encounter: Payer: Self-pay | Admitting: Internal Medicine

## 2012-11-25 ENCOUNTER — Ambulatory Visit (INDEPENDENT_AMBULATORY_CARE_PROVIDER_SITE_OTHER): Payer: Medicare Other | Admitting: Internal Medicine

## 2012-11-25 VITALS — BP 116/80 | HR 59 | Ht 62.5 in | Wt 192.2 lb

## 2012-11-25 DIAGNOSIS — R002 Palpitations: Secondary | ICD-10-CM

## 2012-11-25 DIAGNOSIS — I059 Rheumatic mitral valve disease, unspecified: Secondary | ICD-10-CM

## 2012-11-25 DIAGNOSIS — R600 Localized edema: Secondary | ICD-10-CM

## 2012-11-25 DIAGNOSIS — I10 Essential (primary) hypertension: Secondary | ICD-10-CM

## 2012-11-25 DIAGNOSIS — E039 Hypothyroidism, unspecified: Secondary | ICD-10-CM

## 2012-11-25 DIAGNOSIS — R011 Cardiac murmur, unspecified: Secondary | ICD-10-CM

## 2012-11-25 DIAGNOSIS — R609 Edema, unspecified: Secondary | ICD-10-CM

## 2012-11-25 NOTE — Patient Instructions (Signed)
Follow up as needed

## 2012-11-26 ENCOUNTER — Encounter: Payer: Self-pay | Admitting: Internal Medicine

## 2012-11-26 NOTE — Progress Notes (Signed)
OFFICE NOTE  Chief Complaint:  Lower extremity edema, heel pain  Primary Care Physician: Alva Garnet., MD  HPI:  Amanda Bender is a pleasant 72 year old female with a number of medical problems. 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 Her family history significant mostly for cancer and some heart disease in her father. She was referred for evaluation of lower extremity swelling which he says is been occurring over the past several months to a year. The swelling worsens when she is on her feet and improves after elevating them at night. There is no history of varicose veins in the family and she has never been diagnosed with in the past. She does have some neuropathic type pain including pain in both heels, which is yet to have evaluated. She says is painful when she walks especially bearing weight on her heels. She is on a medications that are typically associated with lower extremity swelling. She is also not on a diuretic.  PMHx:  Past Medical History  Diagnosis Date  . Hypertension   . Thyroid disease   . Mitral prolapse     Past Surgical History  Procedure Laterality Date  . Abdominal hysterectomy    . Laparoscopy for ectopic pregnancy    . Bunionectomy      FAMHx:  No family history on file.  SOCHx:   reports that she has quit smoking. She has never used smokeless tobacco. She reports that she does not drink alcohol or use illicit drugs.  ALLERGIES:  Allergies  Allergen Reactions  . Iohexol      Code: HIVES, Desc: pt states hives w/IVP dye several yrs ago, pt premedicated w/50mg  benadryl 1 hr prior to scan per Dr.Stroud and pt was fine   . Sulfonamide Derivatives     ROS: A comprehensive review of systems was negative except for: Cardiovascular: positive for lower extremity edema Musculoskeletal: positive for Heel pain bilaterally  HOME MEDS: Current  Outpatient Prescriptions  Medication Sig Dispense Refill  . Acetaminophen (TYLENOL PO) Take 1 tablet by mouth as needed.      Mack Guise THYROID PO Take 1 tablet by mouth daily.      Marland Kitchen aspirin 81 MG tablet Take 81 mg by mouth as needed for pain.      . Cholecalciferol (VITAMIN D-3 PO) Take 1 tablet by mouth daily.      . Coenzyme Q10 (COQ-10 PO) Take 1 capsule by mouth daily.      Marland Kitchen DIOVAN 320 MG tablet 1 tablet daily.      Marland Kitchen FISH OIL-KRILL OIL PO Take 1 capsule by mouth daily.      . metoprolol (LOPRESSOR) 50 MG tablet 1 tablet daily.      . Multiple Vitamin (MULTIVITAMIN) tablet Take 1 tablet by mouth daily.      . Naproxen Sodium (ALEVE PO) Take 1 tablet by mouth as needed.      Marland Kitchen spironolactone (ALDACTONE) 25 MG tablet Take 25 mg by mouth 2 (two) times daily.      . vitamin E 400 UNIT capsule Take 400 Units by mouth daily.       No current facility-administered medications for this visit.    LABS/IMAGING: No results found for this or any previous visit (from the past 48 hour(s)). No results found.  VITALS: BP 116/80  Pulse 59  Ht 5' 2.5" (1.588 m)  Wt 192 lb 3.2 oz (87.181 kg)  BMI 34.57 kg/m2  EXAM: General appearance: alert and no distress Neck: no adenopathy, no carotid bruit, no JVD, supple, symmetrical, trachea midline and thyroid not enlarged, symmetric, no tenderness/mass/nodules Lungs: clear to auscultation bilaterally Heart: regular rate and rhythm, S1, S2 normal, no murmur, click, rub or gallop Abdomen: soft, non-tender; bowel sounds normal; no masses,  no organomegaly Extremities: Bilateral, lower sternum and a 1+ edema. No particular veins, varicose veins, or other stigmata of chronic venous disease. Pulses: 2+ and symmetric Skin: Slightly tense, with some brown macules Neurologic: Grossly normal Psych: Mildly anxious  EKG: Normal sinus rhythm at 59, no ischemia  ASSESSMENT: 1. Bilateral lower chimney edema, possibly related to neuropathy and/or  lymphedema  PLAN: 1.   Ms. Oliff's lower extremity edema is likely related to lymphedema. She does report episodes where her legs will swell out of nowhere. This may be related in addition to salt sensitivity. Have asked her to avoid that in her diet. There no offending medications in ICU. She may benefit from lower extremity compression stockings, however she was resistant to that idea. She thinks they're too difficult to get on. Also recommended that she could take a low-dose diuretic. I don't see any other signs of peripheral vascular disease, and therefore lower extermity Dopplers are not likely to be helpful.  Unfortunately, lymphedema is difficult to treat, and we will I. mostly on stockings and elevation and diuretics period.  Thanks for the kind referral. She can follow-up with me as needed.  Chrystie Nose, MD, [facility] Attending Cardiologist [facility]  Avraham Benish C 11/26/2012, 11:37 AM

## 2013-02-14 ENCOUNTER — Other Ambulatory Visit: Payer: Self-pay | Admitting: Dermatology

## 2013-05-02 ENCOUNTER — Other Ambulatory Visit: Payer: Self-pay | Admitting: Dermatology

## 2013-06-23 ENCOUNTER — Ambulatory Visit (INDEPENDENT_AMBULATORY_CARE_PROVIDER_SITE_OTHER): Payer: Medicare Other | Admitting: Internal Medicine

## 2013-06-23 ENCOUNTER — Encounter: Payer: Self-pay | Admitting: Internal Medicine

## 2013-06-23 VITALS — BP 110/80 | HR 61 | Ht 62.5 in | Wt 194.9 lb

## 2013-06-23 DIAGNOSIS — R0602 Shortness of breath: Secondary | ICD-10-CM

## 2013-06-23 DIAGNOSIS — IMO0001 Reserved for inherently not codable concepts without codable children: Secondary | ICD-10-CM

## 2013-06-23 DIAGNOSIS — R0600 Dyspnea, unspecified: Secondary | ICD-10-CM | POA: Insufficient documentation

## 2013-06-23 DIAGNOSIS — R011 Cardiac murmur, unspecified: Secondary | ICD-10-CM

## 2013-06-23 DIAGNOSIS — E0500 Thyrotoxicosis with diffuse goiter without thyrotoxic crisis or storm: Secondary | ICD-10-CM

## 2013-06-23 DIAGNOSIS — R5383 Other fatigue: Secondary | ICD-10-CM

## 2013-06-23 DIAGNOSIS — G8929 Other chronic pain: Secondary | ICD-10-CM

## 2013-06-23 DIAGNOSIS — R5381 Other malaise: Secondary | ICD-10-CM

## 2013-06-23 DIAGNOSIS — I10 Essential (primary) hypertension: Secondary | ICD-10-CM

## 2013-06-23 DIAGNOSIS — R0989 Other specified symptoms and signs involving the circulatory and respiratory systems: Secondary | ICD-10-CM

## 2013-06-23 DIAGNOSIS — R0609 Other forms of dyspnea: Secondary | ICD-10-CM

## 2013-06-23 DIAGNOSIS — I059 Rheumatic mitral valve disease, unspecified: Secondary | ICD-10-CM

## 2013-06-23 DIAGNOSIS — R002 Palpitations: Secondary | ICD-10-CM | POA: Insufficient documentation

## 2013-06-23 DIAGNOSIS — M7918 Myalgia, other site: Secondary | ICD-10-CM

## 2013-06-23 MED ORDER — METOPROLOL TARTRATE 50 MG PO TABS: 50.0000 mg | ORAL_TABLET | Freq: Two times a day (BID) | ORAL | Status: DC

## 2013-06-23 NOTE — Patient Instructions (Signed)
Your physician has recommended that you wear an event monitor. Event monitors are medical devices that record the heart's electrical activity. Doctors most often [place] these monitors to diagnose arrhythmias. Arrhythmias are problems with the speed or rhythm of the heartbeat. The monitor is a small, portable device. You can wear one while you do your normal daily activities. This is usually used to diagnose what is causing palpitations/syncope (passing out).  You will wear this for 1 week.   Your physician has requested that you have an echocardiogram. Echocardiography is a painless test that uses sound waves to create images of your heart. It provides your doctor with information about the size and shape of your heart and how well your heart's chambers and valves are working. This procedure takes approximately one hour. There are no restrictions for this procedure.  Your physician recommends that you schedule a follow-up appointment in 2-3 weeks - after your test/monitor.

## 2013-06-23 NOTE — Progress Notes (Signed)
OFFICE NOTE  Chief Complaint:  Lower extremity edema, heel pain  Primary Care Physician: Salena Saner., MD  HPI:  Amanda Bender is a pleasant 73 year old female with a number of medical problems. 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 Apparently she also has a history of murmur since she was a child and was once diagnosed with mitral valve prolapse in the past the. Her family history is significant mostly for cancer and some heart disease in her father. She was referred for evaluation of lower extremity swelling earlier in the year, for which he says is been occurring over the past several months to a year. The swelling worsens when she is on her feet and improves after elevating them at night. There is no history of varicose veins in the family and she has never been diagnosed with in the past. She does have some neuropathic type pain including pain in both heels, which is yet to have evaluated. She says is painful when she walks especially bearing weight on her heels. She is on a medications that are typically associated with lower extremity swelling. She is also not on a diuretic. Imaging was performed which was negative for any venous insufficiency, and she was diagnosed with lymphedema.   She was now referred back for evaluation of palpitations. She reports a recently she's been having an increase in fatigue, shortness of breath, especially when walking up stairs and associated palpitations. At times she feels like her heart races. She's tried coughing and bearing down to stop this but it has no effect on it. The episodes occur almost on a daily basis. She had previously seen Dr. Einar Gip and had worn a monitor for some period of time which apparently was unrevealing. She also reports chronic fatigue symptoms as well as myofascial pain which occurs in her bilateral upper arms and upper  trapezius and lower cervical spine areas as well as her legs. She reports episodes of paroxysmal pain, shortness of breath fatigue and rapid heart rate which could be signs of fibromyalgia.  Finally she reports palpitations that have improved with self treatment. She increased her metoprolol tartrate from once daily 50 mg to 50 mg in the morning and 25 mg at night. Then she started to increase it to 50 mg twice daily. She is currently taking this dose, but reports that in the early afternoon or early morning that her palpitations do "breakthrough".  PMHx:  Past Medical History  Diagnosis Date  . Hypertension   . Thyroid disease   . Mitral prolapse     Past Surgical History  Procedure Laterality Date  . Abdominal hysterectomy    . Laparoscopy for ectopic pregnancy    . Bunionectomy      FAMHx:  No family history on file.  SOCHx:   reports that she has quit smoking. She has never used smokeless tobacco. She reports that she does not drink alcohol or use illicit drugs.  ALLERGIES:  Allergies  Allergen Reactions  . Iohexol      Code: HIVES, Desc: pt states hives w/IVP dye several yrs ago, pt premedicated w/50mg  benadryl 1 hr prior to scan per Dr.Stroud and pt was fine   . Sulfonamide Derivatives     ROS: A comprehensive review of systems was negative except for: Constitutional: positive for fatigue Respiratory: positive for dyspnea on exertion Cardiovascular: positive for lower extremity edema and palpitations Musculoskeletal: positive for myalgias  Behavioral/Psych: positive for anxiety  HOME MEDS: Current Outpatient Prescriptions  Medication Sig Dispense Refill  . Acetaminophen (TYLENOL PO) Take 1 tablet by mouth as needed.      Francia Greaves THYROID PO Take 60 mg by mouth every other day.       Marland Kitchen aspirin 81 MG tablet Take 81 mg by mouth daily.       . Cholecalciferol (VITAMIN D-3 PO) Take 1 tablet by mouth daily.      . Coenzyme Q10 (COQ-10 PO) Take 1 capsule by mouth daily.       Marland Kitchen DIOVAN 320 MG tablet Take 1 tablet by mouth daily.       Marland Kitchen FISH OIL-KRILL OIL PO Take 1 capsule by mouth daily.      . metoprolol (LOPRESSOR) 50 MG tablet Take 1 tablet (50 mg total) by mouth 2 (two) times daily.  180 tablet  2  . Multiple Vitamin (MULTIVITAMIN) tablet Take 1 tablet by mouth daily.      . Naproxen Sodium (ALEVE PO) Take 1 tablet by mouth as needed.      Marland Kitchen spironolactone (ALDACTONE) 25 MG tablet Take 25 mg by mouth daily.       . valACYclovir (VALTREX) 500 MG tablet Take 500 mg by mouth daily.      . vitamin E 400 UNIT capsule Take 400 Units by mouth daily.       No current facility-administered medications for this visit.    LABS/IMAGING: No results found for this or any previous visit (from the past 48 hour(s)). No results found.  VITALS: BP 110/80  Pulse 61  Ht 5' 2.5" (1.588 m)  Wt 194 lb 14.4 oz (88.406 kg)  BMI 35.06 kg/m2  EXAM: General appearance: alert and no distress Neck: no adenopathy, no carotid bruit, no JVD, supple, symmetrical, trachea midline and thyroid not enlarged, symmetric, no tenderness/mass/nodules Lungs: clear to auscultation bilaterally Heart: regular rate and rhythm, S1, S2 normal, 2/6 SEM at LLSB Abdomen: soft, non-tender; bowel sounds normal; no masses,  no organomegaly Extremities: Bilateral, lower sternum and a 1+ edema. No particular veins, varicose veins, or other stigmata of chronic venous disease. Pulses: 2+ and symmetric Skin: Slightly tense, with some brown macules Neurologic: Grossly normal Psych: Mildly anxious  EKG: deferred  ASSESSMENT: 1. DOE 2. Mitral murmur 3. Frequent palpitaitons 4. Fatigue/chronic myofascial pain  PLAN: 1.   [name] is complaining of progressive shortness of breath with exertion, palpitations, some chronic fatigue and myofascial pain, which may indicate fibromyalgia with chronic fatigue syndrome. Her palpitations are better controlled on twice daily metoprolol tartrate, but there  is still some breakthrough. I would like to place a one-week monitor to see what she is having although her EKG today did show an isolated PVC.  In addition I like for her to have an echocardiogram carotid and basilar or structural abnormalities. She denies any chest pain and did have a mild cardiac catheterization which showed no coronary disease.  If her monitor deftly shows a pattern of breakthrough palpitations, I would recommend switching her metoprolol over to metoprolol succinate at the same dose of 50 mg twice daily.  Pixie Casino, MD, [facility] Attending Cardiologist [facility] 06/23/2013, 1:20 PM

## 2013-06-24 ENCOUNTER — Encounter: Payer: Self-pay | Admitting: Internal Medicine

## 2013-06-28 ENCOUNTER — Ambulatory Visit: Payer: Medicare Other | Admitting: Internal Medicine

## 2013-07-01 ENCOUNTER — Telehealth: Payer: Self-pay | Admitting: Internal Medicine

## 2013-07-01 NOTE — Telephone Encounter (Signed)
Mailed her monitor in yesterday,she forgot to mail the charger. She wants to know if she can bring it here next week when she have her appt on 07-07-13? Please let her know what to do asap .

## 2013-07-01 NOTE — Telephone Encounter (Signed)
Returned call and pt verified x 2.  Pt informed message received and she will need to call [employer] to make arrangements.  Pt verbalized understanding and agreed w/ plan.  Phone number given to pt and pt repeated number back for confirmation.

## 2013-07-05 ENCOUNTER — Other Ambulatory Visit: Payer: Self-pay | Admitting: *Deleted

## 2013-07-05 DIAGNOSIS — R002 Palpitations: Secondary | ICD-10-CM

## 2013-07-07 ENCOUNTER — Ambulatory Visit (HOSPITAL_COMMUNITY)
Admission: RE | Admit: 2013-07-07 | Discharge: 2013-07-07 | Disposition: A | Discharge status: Home / Self Care | Payer: Medicare Other | Source: Ambulatory Visit | Attending: Cardiovascular Disease | Admitting: Cardiovascular Disease

## 2013-07-07 DIAGNOSIS — I059 Rheumatic mitral valve disease, unspecified: Secondary | ICD-10-CM

## 2013-07-07 DIAGNOSIS — R0602 Shortness of breath: Secondary | ICD-10-CM

## 2013-07-07 DIAGNOSIS — R002 Palpitations: Secondary | ICD-10-CM

## 2013-07-07 NOTE — Progress Notes (Signed)
2D Echo Performed 07/07/2013    Merilyn Pagan, RCS  

## 2013-07-11 NOTE — Progress Notes (Signed)
LM on daughter's number to call office back with mother's phone number.. Number provided is incorrect (number called and person answering stated our office has been calling multiple times)

## 2013-07-13 ENCOUNTER — Telehealth: Payer: Self-pay | Admitting: *Deleted

## 2013-07-13 NOTE — Telephone Encounter (Signed)
Patient notified of echo & monitor results. Will f/up on Friday 2/6

## 2013-07-13 NOTE — Telephone Encounter (Signed)
LMTCB

## 2013-07-13 NOTE — Telephone Encounter (Signed)
Pt stated that her daughter stated that we were trying to get in touch with her regarding test results and did not have a phone number for her. I verified her number and it was correct, I didn't see where anyone has tried to call her. She would like a call back regarding this matter.  [place]

## 2013-07-13 NOTE — Telephone Encounter (Signed)
Message forwarded to J. Elkins, RN.  

## 2013-07-15 ENCOUNTER — Ambulatory Visit (INDEPENDENT_AMBULATORY_CARE_PROVIDER_SITE_OTHER): Payer: Medicare Other | Admitting: Internal Medicine

## 2013-07-15 ENCOUNTER — Encounter: Payer: Self-pay | Admitting: Internal Medicine

## 2013-07-15 VITALS — BP 134/74 | HR 55 | Ht 62.5 in | Wt 193.4 lb

## 2013-07-15 DIAGNOSIS — I493 Ventricular premature depolarization: Secondary | ICD-10-CM

## 2013-07-15 DIAGNOSIS — R0600 Dyspnea, unspecified: Secondary | ICD-10-CM

## 2013-07-15 DIAGNOSIS — R002 Palpitations: Secondary | ICD-10-CM

## 2013-07-15 DIAGNOSIS — R5383 Other fatigue: Secondary | ICD-10-CM

## 2013-07-15 DIAGNOSIS — R011 Cardiac murmur, unspecified: Secondary | ICD-10-CM

## 2013-07-15 DIAGNOSIS — R0989 Other specified symptoms and signs involving the circulatory and respiratory systems: Secondary | ICD-10-CM

## 2013-07-15 DIAGNOSIS — R5381 Other malaise: Secondary | ICD-10-CM

## 2013-07-15 DIAGNOSIS — R0602 Shortness of breath: Secondary | ICD-10-CM

## 2013-07-15 DIAGNOSIS — I4949 Other premature depolarization: Secondary | ICD-10-CM

## 2013-07-15 DIAGNOSIS — R0609 Other forms of dyspnea: Secondary | ICD-10-CM

## 2013-07-15 NOTE — Progress Notes (Signed)
OFFICE NOTE  Chief Complaint:  Lower extremity edema, heel pain  Primary Care Physician: Amanda Bender., MD  HPI:  Amanda Bender is a pleasant 73 year old female with a number of medical problems. 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 Apparently she also has a history of murmur since she was a child and was once diagnosed with mitral valve prolapse in the past the. Her family history is significant mostly for cancer and some heart disease in her father. She was referred for evaluation of lower extremity swelling earlier in the year, for which he says is been occurring over the past several months to a year. The swelling worsens when she is on her feet and improves after elevating them at night. There is no history of varicose veins in the family and she has never been diagnosed with in the past. She does have some neuropathic type pain including pain in both heels, which is yet to have evaluated. She says is painful when she walks especially bearing weight on her heels. She is on a medications that are typically associated with lower extremity swelling. She is also not on a diuretic. Imaging was performed which was negative for any venous insufficiency, and she was diagnosed with lymphedema.   She was now referred back for evaluation of palpitations. She reports a recently she's been having an increase in fatigue, shortness of breath, especially when walking up stairs and associated palpitations. At times she feels like her heart races. She's tried coughing and bearing down to stop this but it has no effect on it. The episodes occur almost on a daily basis. She had previously seen Dr. Einar Gip and had worn a monitor for some period of time which apparently was unrevealing. She also reports chronic fatigue symptoms as well as myofascial pain which occurs in her bilateral upper arms and upper  trapezius and lower cervical spine areas as well as her legs. She reports episodes of paroxysmal pain, shortness of breath fatigue and rapid heart rate which could be signs of fibromyalgia.  Finally she reports palpitations that have improved with self treatment. She increased her metoprolol tartrate from once daily 50 mg to 50 mg in the morning and 25 mg at night. Then she started to increase it to 50 mg twice daily. She is currently taking this dose, but reports that in the early afternoon or early morning that her palpitations do "breakthrough".  As [name] returns today for followup of her echocardiogram and monitor. The echocardiogram did demonstrate mild mitral regurgitation, grade 1 diastolic dysfunction and an EF of 65-70%. No wall motion abnormalities. Her monitor did demonstrate PVCs which were isolated and seem to come at various times throughout the day. There was no significant nonsustained ventricular tachycardia or other worrisome rhythms. She continues to complain of palpitations and shortness of breath with activities that previously did not bother her. Her course has been more weight gain recently as well. She continues to deny any chest pain.  PMHx:  Past Medical History  Diagnosis Date  . Hypertension   . Thyroid disease   . Mitral prolapse     Past Surgical History  Procedure Laterality Date  . Abdominal hysterectomy    . Laparoscopy for ectopic pregnancy    . Bunionectomy      FAMHx:  No family history on file.  SOCHx:   reports that she has quit smoking. She has never  used smokeless tobacco. She reports that she does not drink alcohol or use illicit drugs.  ALLERGIES:  Allergies  Allergen Reactions  . Iohexol      Code: HIVES, Desc: pt states hives w/IVP dye several yrs ago, pt premedicated w/50mg  benadryl 1 hr prior to scan per Dr.Stroud and pt was fine   . Sulfonamide Derivatives     ROS: A comprehensive review of systems was negative except for:  Constitutional: positive for fatigue Respiratory: positive for dyspnea on exertion Cardiovascular: positive for lower extremity edema and palpitations Musculoskeletal: positive for myalgias Behavioral/Psych: positive for anxiety  HOME MEDS: Current Outpatient Prescriptions  Medication Sig Dispense Refill  . Acetaminophen (TYLENOL PO) Take 1 tablet by mouth as needed.      Francia Greaves THYROID PO Take 60 mg by mouth every other day.       Marland Kitchen aspirin 81 MG tablet Take 81 mg by mouth daily.       . Cholecalciferol (VITAMIN D-3 PO) Take 1 tablet by mouth daily.      . Coenzyme Q10 (COQ-10 PO) Take 1 capsule by mouth daily.      Marland Kitchen DIOVAN 320 MG tablet Take 1 tablet by mouth daily.       Marland Kitchen FISH OIL-KRILL OIL PO Take 1 capsule by mouth daily.      . metoprolol (LOPRESSOR) 50 MG tablet Take 1 tablet (50 mg total) by mouth 2 (two) times daily.  180 tablet  2  . Multiple Vitamin (MULTIVITAMIN) tablet Take 1 tablet by mouth daily.      . Naproxen Sodium (ALEVE PO) Take 1 tablet by mouth as needed.      . Probiotic Product (ALIGN PO) Take 1 capsule by mouth daily.      Marland Kitchen spironolactone (ALDACTONE) 25 MG tablet Take 25 mg by mouth daily.       . valACYclovir (VALTREX) 500 MG tablet Take 500 mg by mouth daily.      . vitamin E 400 UNIT capsule Take 400 Units by mouth daily.       No current facility-administered medications for this visit.    LABS/IMAGING: No results found for this or any previous visit (from the past 48 hour(s)). No results found.  VITALS: BP 134/74  Pulse 55  Ht 5' 2.5" (1.588 m)  Wt 193 lb 6.4 oz (87.726 kg)  BMI 34.79 kg/m2  EXAM: deferred  EKG: deferred  ASSESSMENT: 1. DOE 2. Mitral murmur 3. Frequent palpitaitons 4. Fatigue/chronic myofascial pain  PLAN: 1.   Amanda Bender continues to be having palpitations and shortness of breath. Her echocardiogram only shows mild abnormalities and diastolic function and mild mitral regurgitation.  I would be surprised if  her palpitations are related to ischemia, however functional testing is indicated and she is interested in getting a better understanding why she is short of breath. I suspect of her shortness of breath is due to the excess weight. I would recommend cardiometabolic testing which could help [place] determine the cause of her shortness of breath as well as risk stratify her to try to identify possible coronary disease.  Plan to see her back to discuss her cardiometabolic testing in a few weeks.  Pixie Casino, MD, [facility] Attending Cardiologist [facility] [place] [facility] 07/15/2013, 1:42 PM

## 2013-07-15 NOTE — Patient Instructions (Signed)
Your physician recommends that you schedule a follow-up appointment two weeks after met test

## 2013-07-18 DIAGNOSIS — I493 Ventricular premature depolarization: Secondary | ICD-10-CM

## 2013-07-18 DIAGNOSIS — R0602 Shortness of breath: Secondary | ICD-10-CM

## 2013-07-29 ENCOUNTER — Ambulatory Visit (INDEPENDENT_AMBULATORY_CARE_PROVIDER_SITE_OTHER): Payer: Medicare Other | Admitting: Internal Medicine

## 2013-07-29 ENCOUNTER — Encounter: Payer: Self-pay | Admitting: Internal Medicine

## 2013-07-29 VITALS — BP 132/70 | HR 64 | Ht 62.5 in | Wt 195.6 lb

## 2013-07-29 DIAGNOSIS — R0989 Other specified symptoms and signs involving the circulatory and respiratory systems: Secondary | ICD-10-CM

## 2013-07-29 DIAGNOSIS — R0600 Dyspnea, unspecified: Secondary | ICD-10-CM

## 2013-07-29 DIAGNOSIS — R011 Cardiac murmur, unspecified: Secondary | ICD-10-CM

## 2013-07-29 DIAGNOSIS — R002 Palpitations: Secondary | ICD-10-CM

## 2013-07-29 DIAGNOSIS — I10 Essential (primary) hypertension: Secondary | ICD-10-CM

## 2013-07-29 DIAGNOSIS — R0609 Other forms of dyspnea: Secondary | ICD-10-CM

## 2013-07-29 DIAGNOSIS — I059 Rheumatic mitral valve disease, unspecified: Secondary | ICD-10-CM

## 2013-07-29 NOTE — Progress Notes (Signed)
OFFICE NOTE  Chief Complaint:  Lower extremity edema, heel pain  Primary Care Physician: Salena Saner., MD  HPI:  Amanda Bender is a pleasant 73 year old female with a number of medical problems. 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 Apparently she also has a history of murmur since she was a child and was once diagnosed with mitral valve prolapse in the past the. Her family history is significant mostly for cancer and some heart disease in her father. She was referred for evaluation of lower extremity swelling earlier in the year, for which he says is been occurring over the past several months to a year. The swelling worsens when she is on her feet and improves after elevating them at night. There is no history of varicose veins in the family and she has never been diagnosed with in the past. She does have some neuropathic type pain including pain in both heels, which is yet to have evaluated. She says is painful when she walks especially bearing weight on her heels. She is on a medications that are typically associated with lower extremity swelling. She is also not on a diuretic. Imaging was performed which was negative for any venous insufficiency, and she was diagnosed with lymphedema.   She was now referred back for evaluation of palpitations. She reports a recently she's been having an increase in fatigue, shortness of breath, especially when walking up stairs and associated palpitations. At times she feels like her heart races. She's tried coughing and bearing down to stop this but it has no effect on it. The episodes occur almost on a daily basis. She had previously seen Dr. Einar Gip and had worn a monitor for some period of time which apparently was unrevealing. She also reports chronic fatigue symptoms as well as myofascial pain which occurs in her bilateral upper arms and upper  trapezius and lower cervical spine areas as well as her legs. She reports episodes of paroxysmal pain, shortness of breath fatigue and rapid heart rate which could be signs of fibromyalgia.  Finally she reports palpitations that have improved with self treatment. She increased her metoprolol tartrate from once daily 50 mg to 50 mg in the morning and 25 mg at night. Then she started to increase it to 50 mg twice daily. She is currently taking this dose, but reports that in the early afternoon or early morning that her palpitations do "breakthrough".  [name] returns today for followup of her echocardiogram and monitor. The echocardiogram did demonstrate mild mitral regurgitation, grade 1 diastolic dysfunction and an EF of 65-70%. No wall motion abnormalities. Her monitor did demonstrate PVCs which were isolated and seem to come at various times throughout the day. There was no significant nonsustained ventricular tachycardia or other worrisome rhythms. She continues to complain of palpitations and shortness of breath with activities that previously did not bother her. Her course has been more weight gain recently as well. She continues to deny any chest pain.  [name] did undergo a metabolic test. This demonstrated a less than optimal effort with a decreased VO2 of 74%.  Peak heart rate was 68% predicted, and there was flattening of the heart rate and VO2 curves. There is a sharp rise in VO2 during free pedaling suggesting that her obesity is contributing to some of her shortness of breath. In addition, the flattening of her heart rate her suggest an element of  medication related chronotropic incompetence.  Unfortunately, she feels that she needs higher doses of beta blocker to suppress her PVCs. In fact she reports that her palpitations are improved on higher dose beta blocker and that it is actually improving her shortness of breath.  PMHx:  Past Medical History  Diagnosis Date  .  Hypertension   . Thyroid disease   . Mitral prolapse     Past Surgical History  Procedure Laterality Date  . Abdominal hysterectomy    . Laparoscopy for ectopic pregnancy    . Bunionectomy      FAMHx:  No family history on file.  SOCHx:   reports that she has quit smoking. She has never used smokeless tobacco. She reports that she does not drink alcohol or use illicit drugs.  ALLERGIES:  Allergies  Allergen Reactions  . Iohexol      Code: HIVES, Desc: pt states hives w/IVP dye several yrs ago, pt premedicated w/50mg  benadryl 1 hr prior to scan per Dr.Stroud and pt was fine   . Sulfonamide Derivatives     ROS: A comprehensive review of systems was negative except for: Constitutional: positive for fatigue Respiratory: positive for dyspnea on exertion Cardiovascular: positive for lower extremity edema and palpitations Musculoskeletal: positive for myalgias Behavioral/Psych: positive for anxiety  HOME MEDS: Current Outpatient Prescriptions  Medication Sig Dispense Refill  . Acetaminophen (TYLENOL PO) Take 1 tablet by mouth as needed.      Francia Greaves THYROID PO Take 60 mg by mouth every other day.       Marland Kitchen aspirin 81 MG tablet Take 81 mg by mouth daily.       . Cholecalciferol (VITAMIN D-3 PO) Take 1 tablet by mouth daily.      . Coenzyme Q10 (COQ-10 PO) Take 1 capsule by mouth daily.      Marland Kitchen DIOVAN 320 MG tablet Take 1 tablet by mouth daily.       Marland Kitchen FISH OIL-KRILL OIL PO Take 1 capsule by mouth daily.      . metoprolol (LOPRESSOR) 50 MG tablet Take 1 tablet (50 mg total) by mouth 2 (two) times daily.  180 tablet  2  . Multiple Vitamin (MULTIVITAMIN) tablet Take 1 tablet by mouth daily.      . Naproxen Sodium (ALEVE PO) Take 1 tablet by mouth as needed.      . Probiotic Product (ALIGN PO) Take 1 capsule by mouth daily.      Marland Kitchen spironolactone (ALDACTONE) 25 MG tablet Take 25 mg by mouth daily.       . valACYclovir (VALTREX) 500 MG tablet Take 500 mg by mouth daily.      .  vitamin E 400 UNIT capsule Take 400 Units by mouth daily.       No current facility-administered medications for this visit.    LABS/IMAGING: No results found for this or any previous visit (from the past 48 hour(s)). No results found.  VITALS: BP 132/70  Pulse 64  Ht 5' 2.5" (1.588 m)  Wt 195 lb 9.6 oz (88.724 kg)  BMI 35.18 kg/m2  EXAM: deferred  EKG: deferred  ASSESSMENT: 1. DOE 2. Mitral murmur 3. Frequent palpitaitons 4. Fatigue/chronic myofascial pain  PLAN: 1.   Ms. Vonasek has a fairly low-risk cardio metabolic tests. Her VO2 is reduced, but her exercise effort was not maximal and somewhat blunted by high dose beta blocker. Nevertheless, I believe the answer to her shortness of breath his improved exercise and weight loss. She wishes  to stay on higher dose beta blocker now for better suppression of her palpitations. Plan to see her back in 6 months to monitor her progress.  Pixie Casino, MD, [facility] Attending Cardiologist [facility] 07/29/2013, 4:57 PM

## 2013-07-29 NOTE — Patient Instructions (Addendum)
Dr Hilty wants you to follow-up in 6 months. You will receive a reminder letter in the mail two months in advance. If you don't receive a letter, please call our office to schedule the follow-up appointment. 

## 2013-11-01 ENCOUNTER — Other Ambulatory Visit (HOSPITAL_COMMUNITY): Payer: Self-pay | Admitting: Obstetrics and Gynecology

## 2013-11-01 DIAGNOSIS — Z1231 Encounter for screening mammogram for malignant neoplasm of breast: Secondary | ICD-10-CM

## 2013-11-02 ENCOUNTER — Ambulatory Visit (INDEPENDENT_AMBULATORY_CARE_PROVIDER_SITE_OTHER): Payer: 59 | Admitting: Podiatry

## 2013-11-02 ENCOUNTER — Encounter: Payer: Self-pay | Admitting: Podiatry

## 2013-11-02 VITALS — BP 123/60 | HR 63 | Resp 16

## 2013-11-02 DIAGNOSIS — M722 Plantar fascial fibromatosis: Secondary | ICD-10-CM

## 2013-11-02 MED ORDER — MELOXICAM 15 MG PO TABS: 15.0000 mg | ORAL_TABLET | Freq: Every day | ORAL | Status: DC

## 2013-11-02 MED ORDER — TRIAMCINOLONE ACETONIDE 10 MG/ML IJ SUSP
10.0000 mg | Freq: Once | INTRAMUSCULAR | Status: AC
Start: 2013-11-02 — End: 2013-11-02
  Administered 2013-11-02: 10 mg

## 2013-11-02 NOTE — Patient Instructions (Signed)

## 2013-11-02 NOTE — Progress Notes (Signed)
Subjective:     Patient ID: Amanda Bender, female   DOB: 11/11/1940, 73 y.o.   MRN: [id number]  HPI patient states that she is having heel pain that it helped him get the medication last year but it started back in she is concerned that she can have this forever   Review of Systems     Objective:   Physical Exam Neurovascular status intact no health history changes noted with discomfort in the plantar heel region of both heels at the insertion of the heel to the calcaneus    Assessment:     Chronic plantar fasciitis with any acute flareup recent    Plan:     Reviewed condition and injected the plantar fascia of both heels 3 mg Kenalog 5 mg I can Marcaine mixture and dispensed night splint with all instructions on usage and discussed possible orthotics in future

## 2013-11-14 ENCOUNTER — Ambulatory Visit (HOSPITAL_COMMUNITY): Payer: Medicare Other

## 2013-11-15 ENCOUNTER — Encounter (INDEPENDENT_AMBULATORY_CARE_PROVIDER_SITE_OTHER): Payer: 59 | Admitting: Ophthalmology

## 2013-11-16 ENCOUNTER — Encounter: Payer: Self-pay | Admitting: Podiatry

## 2013-11-16 ENCOUNTER — Ambulatory Visit (INDEPENDENT_AMBULATORY_CARE_PROVIDER_SITE_OTHER): Payer: 59 | Admitting: Podiatry

## 2013-11-16 ENCOUNTER — Encounter (INDEPENDENT_AMBULATORY_CARE_PROVIDER_SITE_OTHER): Payer: 59 | Admitting: Ophthalmology

## 2013-11-16 VITALS — BP 121/68 | HR 54 | Resp 16

## 2013-11-16 DIAGNOSIS — H43819 Vitreous degeneration, unspecified eye: Secondary | ICD-10-CM

## 2013-11-16 DIAGNOSIS — H2510 Age-related nuclear cataract, unspecified eye: Secondary | ICD-10-CM

## 2013-11-16 DIAGNOSIS — H35039 Hypertensive retinopathy, unspecified eye: Secondary | ICD-10-CM

## 2013-11-16 DIAGNOSIS — M722 Plantar fascial fibromatosis: Secondary | ICD-10-CM

## 2013-11-16 DIAGNOSIS — I10 Essential (primary) hypertension: Secondary | ICD-10-CM

## 2013-11-16 DIAGNOSIS — H31009 Unspecified chorioretinal scars, unspecified eye: Secondary | ICD-10-CM

## 2013-11-17 NOTE — Progress Notes (Signed)
Subjective:     Patient ID: Amanda Bender, female   DOB: 18-Apr-1941, 73 y.o.   MRN: [id number]  HPI patient presents stating my heels are feeling a lot better with pain only after being on them for a long time   Review of Systems     Objective:   Physical Exam Neurovascular status is intact with muscle strength adequate and discomfort in the plantar heel which has improved quite a bit upon palpation    Assessment:     Plantar fasciitis of the heels which is improving at this time    Plan:     Reviewed aggressive home physical therapy and shoe gear modifications with arch support. Advised patient on custom orthotics if symptoms persist

## 2013-11-22 ENCOUNTER — Ambulatory Visit (HOSPITAL_COMMUNITY)
Admission: RE | Admit: 2013-11-22 | Discharge: 2013-11-22 | Disposition: A | Discharge status: Home / Self Care | Payer: Medicare Other | Source: Ambulatory Visit | Attending: Obstetrics and Gynecology | Admitting: Obstetrics and Gynecology

## 2013-11-22 DIAGNOSIS — Z1231 Encounter for screening mammogram for malignant neoplasm of breast: Secondary | ICD-10-CM

## 2014-01-25 ENCOUNTER — Encounter: Payer: Self-pay | Admitting: Internal Medicine

## 2014-01-25 ENCOUNTER — Ambulatory Visit (INDEPENDENT_AMBULATORY_CARE_PROVIDER_SITE_OTHER): Payer: Medicare Other | Admitting: Internal Medicine

## 2014-01-25 VITALS — BP 118/64 | HR 65 | Ht 63.0 in | Wt 189.2 lb

## 2014-01-25 DIAGNOSIS — I059 Rheumatic mitral valve disease, unspecified: Secondary | ICD-10-CM

## 2014-01-25 DIAGNOSIS — R600 Localized edema: Secondary | ICD-10-CM

## 2014-01-25 DIAGNOSIS — R609 Edema, unspecified: Secondary | ICD-10-CM

## 2014-01-25 DIAGNOSIS — R0609 Other forms of dyspnea: Secondary | ICD-10-CM

## 2014-01-25 DIAGNOSIS — I10 Essential (primary) hypertension: Secondary | ICD-10-CM

## 2014-01-25 DIAGNOSIS — R0989 Other specified symptoms and signs involving the circulatory and respiratory systems: Secondary | ICD-10-CM

## 2014-01-25 DIAGNOSIS — R0600 Dyspnea, unspecified: Secondary | ICD-10-CM

## 2014-01-25 DIAGNOSIS — R002 Palpitations: Secondary | ICD-10-CM

## 2014-01-25 MED ORDER — METOPROLOL SUCCINATE ER 50 MG PO TB24: 50.0000 mg | ORAL_TABLET | Freq: Every day | ORAL | Status: DC

## 2014-01-25 NOTE — Patient Instructions (Addendum)
Your physician wants you to follow-up in: 6 months. You will receive a reminder letter in the mail two months in advance. If you don't receive a letter, please call our office to schedule the follow-up appointment.  Your physician has recommended you make the following change in your medication..  1. STOP metoprolol tartrate 2. START metoprolol succinate 50mg  once daily

## 2014-01-27 ENCOUNTER — Encounter: Payer: Self-pay | Admitting: Internal Medicine

## 2014-01-27 NOTE — Progress Notes (Signed)
OFFICE NOTE  Chief Complaint:  Lower extremity edema  Primary Care Physician: Salena Saner., MD  HPI:  Amanda Bender is a pleasant 73 year old female with a number of medical problems. 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 Apparently she also has a history of murmur since she was a child and was once diagnosed with mitral valve prolapse in the past the. Her family history is significant mostly for cancer and some heart disease in her father. She was referred for evaluation of lower extremity swelling earlier in the year, for which he says is been occurring over the past several months to a year. The swelling worsens when she is on her feet and improves after elevating them at night. There is no history of varicose veins in the family and she has never been diagnosed with in the past. She does have some neuropathic type pain including pain in both heels, which is yet to have evaluated. She says is painful when she walks especially bearing weight on her heels. She is on a medications that are typically associated with lower extremity swelling. She is also not on a diuretic. Imaging was performed which was negative for any venous insufficiency, and she was diagnosed with lymphedema.   She was now referred back for evaluation of palpitations. She reports a recently she's been having an increase in fatigue, shortness of breath, especially when walking up stairs and associated palpitations. At times she feels like her heart races. She's tried coughing and bearing down to stop this but it has no effect on it. The episodes occur almost on a daily basis. She had previously seen Dr. Einar Gip and had worn a monitor for some period of time which apparently was unrevealing. She also reports chronic fatigue symptoms as well as myofascial pain which occurs in her bilateral upper arms and upper trapezius and  lower cervical spine areas as well as her legs. She reports episodes of paroxysmal pain, shortness of breath fatigue and rapid heart rate which could be signs of fibromyalgia.  Finally she reports palpitations that have improved with self treatment. She increased her metoprolol tartrate from once daily 50 mg to 50 mg in the morning and 25 mg at night. Then she started to increase it to 50 mg twice daily. She is currently taking this dose, but reports that in the early afternoon or early morning that her palpitations do "breakthrough".  Amanda Bender returns today for followup of her echocardiogram and monitor. The echocardiogram did demonstrate mild mitral regurgitation, grade 1 diastolic dysfunction and an EF of 65-70%. No wall motion abnormalities. Her monitor did demonstrate PVCs which were isolated and seem to come at various times throughout the day. There was no significant nonsustained ventricular tachycardia or other worrisome rhythms. She continues to complain of palpitations and shortness of breath with activities that previously did not bother her. Her course has been more weight gain recently as well. She continues to deny any chest pain.  Amanda Bender did undergo a metabolic test. This demonstrated a less than optimal effort with a decreased VO2 of 74%.  Peak heart rate was 68% predicted, and there was flattening of the heart rate and VO2 curves. There is a sharp rise in VO2 during free pedaling suggesting that her obesity is contributing to some of her shortness of breath. In addition, the flattening of her heart rate her suggest an element of medication related  chronotropic incompetence.  Unfortunately, she feels that she needs higher doses of beta blocker to suppress her PVCs. In fact she reports that her palpitations are improved on higher dose beta blocker and that it is actually improving her shortness of breath.  On followup today Amanda Bender has no new complaints. Her shortness of  breath is stable. Her leg swelling is still present but perhaps slightly better. She did not have a significant palpitations except when she does not get good rest.  PMHx:  Past Medical History  Diagnosis Date  . Hypertension   . Thyroid disease   . Mitral prolapse     Past Surgical History  Procedure Laterality Date  . Abdominal hysterectomy    . Laparoscopy for ectopic pregnancy    . Bunionectomy      FAMHx:  No family history on file.  SOCHx:   reports that she has quit smoking. She has never used smokeless tobacco. She reports that she does not drink alcohol or use illicit drugs.  ALLERGIES:  Allergies  Allergen Reactions  . Iohexol      Code: HIVES, Desc: pt states hives w/IVP dye several yrs ago, pt premedicated w/50mg  benadryl 1 hr prior to scan per Dr.Stroud and pt was fine   . Sulfonamide Derivatives     ROS: A comprehensive review of systems was negative except for: Constitutional: positive for fatigue Respiratory: positive for dyspnea on exertion Cardiovascular: positive for lower extremity edema and palpitations Musculoskeletal: positive for myalgias Behavioral/Psych: positive for anxiety  HOME MEDS: Current Outpatient Prescriptions  Medication Sig Dispense Refill  . Acetaminophen (TYLENOL PO) Take 1 tablet by mouth as needed.      Francia Greaves THYROID PO Take 60 mg by mouth every other day.       Marland Kitchen aspirin 81 MG tablet Take 81 mg by mouth daily.       . Cholecalciferol (VITAMIN D-3 PO) Take 1 tablet by mouth daily.      . Coenzyme Q10 (COQ-10 PO) Take 1 capsule by mouth daily.      Marland Kitchen DIOVAN 320 MG tablet Take 1 tablet by mouth daily.       Marland Kitchen FISH OIL-KRILL OIL PO Take 1 capsule by mouth daily.      . Multiple Vitamin (MULTIVITAMIN) tablet Take 1 tablet by mouth daily.      . Probiotic Product (ALIGN PO) Take 1 capsule by mouth daily.      Marland Kitchen spironolactone (ALDACTONE) 25 MG tablet Take 25 mg by mouth daily.       . valACYclovir (VALTREX) 500 MG tablet Take  500 mg by mouth daily.      . vitamin E 400 UNIT capsule Take 400 Units by mouth daily.      . metoprolol succinate (TOPROL-XL) 50 MG 24 hr tablet Take 1 tablet (50 mg total) by mouth daily. Take with or immediately following a meal.  90 tablet  3  . Naproxen Sodium (ALEVE PO) Take 1 tablet by mouth 2 (two) times daily as needed.        No current facility-administered medications for this visit.    LABS/IMAGING: No results found for this or any previous visit (from the past 48 hour(s)). No results found.  VITALS: BP 118/64  Pulse 65  Ht 5\' 3"  (1.6 m)  Wt 189 lb 3.2 oz (85.821 kg)  BMI 33.52 kg/m2  EXAM: Gen.: Awake, no distress HEENT: PERRLA, EOMI Lungs: Clear bilaterally Cardiovascular: Regular rate rhythm, normal M0-L4, 3/6 systolic  murmur best at the apex Abdomen: Soft, nontender Extremities: Trace bilateral edema Neurologic: Grossly nonfocal Psychiatric: Normal mood, affect  EKG: Normal sinus rhythm at 65  ASSESSMENT: 1. DOE 2. Mitral murmur 3. Frequent palpitaitons 4. Fatigue/chronic myofascial pain  PLAN: 1.   Amanda Bender is doing well with occasional palpitations. Her shortness of breath is stable. Her lower extremity swelling is improved. Blood pressure is well controlled. I would recommend continuing her current medications and plan to see her back in 6 months or sooner as necessary.  Pixie Casino, MD, [facility] Attending Cardiologist [facility] 01/27/2014, 6:29 PM

## 2014-03-16 ENCOUNTER — Other Ambulatory Visit: Payer: Self-pay | Admitting: Internal Medicine

## 2014-03-16 NOTE — Telephone Encounter (Signed)
Refill for meto tart refused *Changed to metoprolol succinate on 01/25/14 by MD

## 2015-01-09 ENCOUNTER — Other Ambulatory Visit: Payer: Self-pay | Admitting: Internal Medicine

## 2015-01-09 NOTE — Telephone Encounter (Signed)
Rx(s) sent to pharmacy electronically.  

## 2015-02-01 ENCOUNTER — Other Ambulatory Visit: Payer: Self-pay | Admitting: Internal Medicine

## 2015-02-01 DIAGNOSIS — E2839 Other primary ovarian failure: Secondary | ICD-10-CM

## 2015-03-07 DIAGNOSIS — R32 Unspecified urinary incontinence: Secondary | ICD-10-CM | POA: Insufficient documentation

## 2015-03-12 ENCOUNTER — Other Ambulatory Visit: Payer: Self-pay | Admitting: Internal Medicine

## 2015-03-19 ENCOUNTER — Other Ambulatory Visit: Payer: Self-pay

## 2015-04-13 ENCOUNTER — Ambulatory Visit
Admission: RE | Admit: 2015-04-13 | Discharge: 2015-04-13 | Disposition: A | Discharge status: Home / Self Care | Payer: Medicare Other | Source: Ambulatory Visit | Attending: Internal Medicine | Admitting: Internal Medicine

## 2015-04-13 DIAGNOSIS — E2839 Other primary ovarian failure: Secondary | ICD-10-CM

## 2015-08-06 ENCOUNTER — Ambulatory Visit (INDEPENDENT_AMBULATORY_CARE_PROVIDER_SITE_OTHER): Payer: Medicare Other | Admitting: Internal Medicine

## 2015-08-06 ENCOUNTER — Encounter (INDEPENDENT_AMBULATORY_CARE_PROVIDER_SITE_OTHER): Payer: Medicare Other

## 2015-08-06 ENCOUNTER — Encounter: Payer: Self-pay | Admitting: Internal Medicine

## 2015-08-06 VITALS — BP 128/74 | HR 60 | Ht 62.5 in | Wt 187.4 lb

## 2015-08-06 DIAGNOSIS — R519 Headache, unspecified: Secondary | ICD-10-CM

## 2015-08-06 DIAGNOSIS — I059 Rheumatic mitral valve disease, unspecified: Secondary | ICD-10-CM

## 2015-08-06 DIAGNOSIS — R51 Headache: Secondary | ICD-10-CM | POA: Diagnosis not present

## 2015-08-06 DIAGNOSIS — R600 Localized edema: Secondary | ICD-10-CM

## 2015-08-06 DIAGNOSIS — R0602 Shortness of breath: Secondary | ICD-10-CM

## 2015-08-06 DIAGNOSIS — R002 Palpitations: Secondary | ICD-10-CM

## 2015-08-06 DIAGNOSIS — R0600 Dyspnea, unspecified: Secondary | ICD-10-CM

## 2015-08-06 DIAGNOSIS — Z79899 Other long term (current) drug therapy: Secondary | ICD-10-CM

## 2015-08-06 DIAGNOSIS — R0609 Other forms of dyspnea: Secondary | ICD-10-CM

## 2015-08-06 MED ORDER — SPIRONOLACTONE 50 MG PO TABS: 50.0000 mg | ORAL_TABLET | Freq: Every day | ORAL | Status: DC

## 2015-08-06 NOTE — Patient Instructions (Addendum)
Medication Instructions:   INCREASE spironolactone to 50mg  once daily  Labwork:  Thursday March 2nd or Friday March 3rd -- Sed Rate, CMET, BNP  Testing/Procedures:  Your physician has requested that you have a lexiscan myoview. For further information please visit [email or web address]. Please follow instruction sheet, as given.  Your physician has requested that you have an echocardiogram @ [address] - [address]. Echocardiography is a painless test that uses sound waves to create images of your heart. It provides your doctor with information about the size and shape of your heart and how well your heart's chambers and valves are working. This procedure takes approximately one hour. There are no restrictions for this procedure.  Your physician has recommended that you wear an event monitor for 7 days. Event monitors are medical devices that record the heart's electrical activity. Doctors most often [place] these monitors to diagnose arrhythmias. Arrhythmias are problems with the speed or rhythm of the heartbeat. The monitor is a small, portable device. You can wear one while you do your normal daily activities. This is usually used to diagnose what is causing palpitations/syncope (passing out).    Follow-Up:  Your physician recommends that you schedule a follow-up appointment after your testing.    Any Other Special Instructions Will Be Listed Below (If Applicable).

## 2015-08-06 NOTE — Progress Notes (Signed)
OFFICE NOTE  Chief Complaint:  Palpitations, dyspnea on exertion, chest tightness, leg swelling, temporal headache  Primary Care Physician: Salena Saner., MD  HPI:  Amanda Bender is a pleasant 75 year old female with a number of medical problems. 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 Apparently she also has a history of murmur since she was a child and was once diagnosed with mitral valve prolapse in the past the. Her family history is significant mostly for cancer and some heart disease in her father. She was referred for evaluation of lower extremity swelling earlier in the year, for which he says is been occurring over the past several months to a year. The swelling worsens when she is on her feet and improves after elevating them at night. There is no history of varicose veins in the family and she has never been diagnosed with in the past. She does have some neuropathic type pain including pain in both heels, which is yet to have evaluated. She says is painful when she walks especially bearing weight on her heels. She is on a medications that are typically associated with lower extremity swelling. She is also not on a diuretic. Imaging was performed which was negative for any venous insufficiency, and she was diagnosed with lymphedema.   She was now referred back for evaluation of palpitations. She reports a recently she's been having an increase in fatigue, shortness of breath, especially when walking up stairs and associated palpitations. At times she feels like her heart races. She's tried coughing and bearing down to stop this but it has no effect on it. The episodes occur almost on a daily basis. She had previously seen Dr. Einar Gip and had worn a monitor for some period of time which apparently was unrevealing. She also reports chronic fatigue symptoms as well as myofascial pain  which occurs in her bilateral upper arms and upper trapezius and lower cervical spine areas as well as her legs. She reports episodes of paroxysmal pain, shortness of breath fatigue and rapid heart rate which could be signs of fibromyalgia.  Finally she reports palpitations that have improved with self treatment. She increased her metoprolol tartrate from once daily 50 mg to 50 mg in the morning and 25 mg at night. Then she started to increase it to 50 mg twice daily. She is currently taking this dose, but reports that in the early afternoon or early morning that her palpitations do "breakthrough".  Amanda Bender returns today for followup of her echocardiogram and monitor. The echocardiogram did demonstrate mild mitral regurgitation, grade 1 diastolic dysfunction and an EF of 65-70%. No wall motion abnormalities. Her monitor did demonstrate PVCs which were isolated and seem to come at various times throughout the day. There was no significant nonsustained ventricular tachycardia or other worrisome rhythms. She continues to complain of palpitations and shortness of breath with activities that previously did not bother her. Her course has been more weight gain recently as well. She continues to deny any chest pain.  [name] did undergo a metabolic test. This demonstrated a less than optimal effort with a decreased VO2 of 74%.  Peak heart rate was 68% predicted, and there was flattening of the heart rate and VO2 curves. There is a sharp rise in VO2 during free pedaling suggesting that her obesity is contributing to some of her shortness of breath. In addition, the flattening of her heart rate  her suggest an element of medication related chronotropic incompetence.  Unfortunately, she feels that she needs higher doses of beta blocker to suppress her PVCs. In fact she reports that her palpitations are improved on higher dose beta blocker and that it is actually improving her shortness of breath.  On  followup today Ms. Bart has no new complaints. Her shortness of breath is stable. Her leg swelling is still present but perhaps slightly better. She did not have a significant palpitations except when she does not get good rest.  I saw Amanda Bender back today in the office. She actually has numerous complaints. Her last exam was a couple of years ago. Today she is presenting with palpitations, worsening constant shortness of breath with minimal exertion, chest pressure, leg swelling, and has reported left temporal headache which is intermittent and not associated with vision changes. Her shortness of breath tends to be with minimal exertion and has been progressive over the past couple of months. Prior to this she had an upper respiratory infection and feels like she "has not completely gotten over it".  PMHx:  Past Medical History  Diagnosis Date  . Hypertension   . Thyroid disease   . Mitral prolapse     Past Surgical History  Procedure Laterality Date  . Abdominal hysterectomy    . Laparoscopy for ectopic pregnancy    . Bunionectomy      FAMHx:  Family History  Problem Relation Age of Onset  . Cancer Maternal Uncle   . Cancer Maternal Grandfather     SOCHx:   reports that she has quit smoking. She has never used smokeless tobacco. She reports that she does not drink alcohol or use illicit drugs.  ALLERGIES:  Allergies  Allergen Reactions  . Iohexol      Code: HIVES, Desc: pt states hives w/IVP dye several yrs ago, pt premedicated w/50mg  benadryl 1 hr prior to scan per Dr.Stroud and pt was fine   . Sulfonamide Derivatives     ROS: A comprehensive review of systems was negative except for: Constitutional: positive for fatigue Respiratory: positive for dyspnea on exertion Cardiovascular: positive for lower extremity edema and palpitations Musculoskeletal: positive for myalgias Neurological: positive for headaches Behavioral/Psych: positive for anxiety  HOME  MEDS: Current Outpatient Prescriptions  Medication Sig Dispense Refill  . Acetaminophen (TYLENOL PO) Take 1 tablet by mouth as needed.    Francia Greaves THYROID PO Take 60 mg by mouth every other day.     Marland Kitchen aspirin 81 MG tablet Take 81 mg by mouth daily.     . B Complex-Biotin-FA (B-COMPLEET-100) TABS Take 1 tablet by mouth daily.    Marland Kitchen DIOVAN 320 MG tablet Take 1 tablet by mouth daily.     . fluticasone (FLONASE) 50 MCG/ACT nasal spray Place 2 sprays into both nostrils daily.    Marland Kitchen MAGNESIUM GLYCINATE PLUS PO Take 300 mg by mouth daily.    . metoprolol succinate (TOPROL-XL) 50 MG 24 hr tablet Take 1 tablet (50 mg total) by mouth daily. PATIENT NEEDS TO CONTACT OFFICE FOR ADDITIONAL REFILLS 90 tablet 0  . Multiple Vitamin (MULTIVITAMIN) tablet Take 1 tablet by mouth daily.    . ranitidine (ZANTAC) 150 MG tablet Take 150 mg by mouth as needed for heartburn.    . spironolactone (ALDACTONE) 50 MG tablet Take 1 tablet (50 mg total) by mouth daily. 30 tablet 6  . valACYclovir (VALTREX) 500 MG tablet Take 500 mg by mouth daily.     No  current facility-administered medications for this visit.    LABS/IMAGING: No results found for this or any previous visit (from the past 48 hour(s)). No results found.  VITALS: BP 128/74 mmHg  Pulse 60  Ht 5' 2.5" (1.588 m)  Wt 187 lb 6.4 oz (85.004 kg)  BMI 33.71 kg/m2  EXAM: Gen.: Awake, no distress HEENT: PERRLA, EOMI Lungs: Clear bilaterally Cardiovascular: Regular rate rhythm, normal [id number], 3/6 systolic murmur best at the apex Abdomen: Soft, nontender Extremities: Trace bilateral edema Neurologic: Grossly nonfocal, no left temporal area tenderness to palpation Psychiatric: Normal mood, affect  EKG: Normal sinus rhythm at 60  ASSESSMENT: 1. Progressive DOE 2. Mitral murmur 3. Frequent palpitaitons 4. Fatigue/chronic myofascial pain 5. Left temporal headache 6. Chest pressure  PLAN: 1.   Ms. Delaguila has numerous complaints today including  worsening shortness of breath, chest pressure, left temporal headache, recurrent palpitations and worsening fatigue. I would recommend a repeat echocardiogram, Lexiscan Myoview and event monitor to try and capture her palpitations. Unfortunately she is allergic to sulfa and cannot use any typical loop diuretics. Will increase her spironolactone to 50 mg daily. Plan to recheck a metabolic profile and BNP later this week. Ultimately, she may need ethacrynic acid if she needs additional diuresis. With regard to her headache. I'm less suspicious of temporal arteritis as a result no tenderness to palpation and she has not reported visual changes. This may be cluster headache or perhaps a variant migraine. Nevertheless, I'll check a sedimentation rate with her lab work.   Plan to see her back to discuss the findings of the studies and see if she's had any benefit with increasing her Aldactone. If there is evidence for worsening heart failure, again we may need to place her on ethacrynic acid due to her significant sulfa allergy and documented intolerance to Lasix in the past which caused significant rash.  Pixie Casino, MD, [facility] Attending Cardiologist [place] 08/06/2015, 6:16 PM

## 2015-08-11 LAB — COMPREHENSIVE METABOLIC PANEL
ALBUMIN: 3.9 g/dL (ref 3.6–5.1)
ALT: 13 U/L (ref 6–29)
AST: 15 U/L (ref 10–35)
Alkaline Phosphatase: 66 U/L (ref 33–130)
BILIRUBIN TOTAL: 0.4 mg/dL (ref 0.2–1.2)
BUN: 22 mg/dL (ref 7–25)
CO2: 27 mmol/L (ref 20–31)
Calcium: 8.9 mg/dL (ref 8.6–10.4)
Chloride: 108 mmol/L (ref 98–110)
Creat: 1.01 mg/dL — ABNORMAL HIGH (ref 0.60–0.93)
Glucose, Bld: 98 mg/dL (ref 65–99)
Potassium: 4.2 mmol/L (ref 3.5–5.3)
Sodium: 142 mmol/L (ref 135–146)
Total Protein: 5.9 g/dL — ABNORMAL LOW (ref 6.1–8.1)

## 2015-08-11 LAB — SEDIMENTATION RATE: Sed Rate: 30 mm/hr (ref 0–30)

## 2015-08-11 LAB — BRAIN NATRIURETIC PEPTIDE: Brain Natriuretic Peptide: 81.6 pg/mL (ref <100)

## 2015-08-12 DIAGNOSIS — R002 Palpitations: Secondary | ICD-10-CM

## 2015-08-23 ENCOUNTER — Inpatient Hospital Stay (HOSPITAL_COMMUNITY): Admission: RE | Admit: 2015-08-23 | Payer: Medicare Other | Source: Ambulatory Visit

## 2015-08-30 ENCOUNTER — Ambulatory Visit: Payer: Medicare Other | Admitting: Internal Medicine

## 2015-09-04 ENCOUNTER — Ambulatory Visit: Payer: Medicare Other | Admitting: Internal Medicine

## 2015-09-12 ENCOUNTER — Telehealth (HOSPITAL_COMMUNITY): Payer: Self-pay

## 2015-09-12 NOTE — Telephone Encounter (Signed)
Encounter complete. 

## 2015-09-14 ENCOUNTER — Ambulatory Visit (HOSPITAL_COMMUNITY)
Admission: RE | Admit: 2015-09-14 | Discharge: 2015-09-14 | Disposition: A | Discharge status: Home / Self Care | Payer: Medicare Other | Source: Ambulatory Visit | Attending: Internal Medicine | Admitting: Internal Medicine

## 2015-09-14 ENCOUNTER — Inpatient Hospital Stay (HOSPITAL_COMMUNITY): Admission: RE | Admit: 2015-09-14 | Payer: Medicare Other | Source: Ambulatory Visit

## 2015-09-14 DIAGNOSIS — I10 Essential (primary) hypertension: Secondary | ICD-10-CM | POA: Diagnosis not present

## 2015-09-14 DIAGNOSIS — R0602 Shortness of breath: Secondary | ICD-10-CM

## 2015-09-14 DIAGNOSIS — R600 Localized edema: Secondary | ICD-10-CM | POA: Diagnosis not present

## 2015-09-14 DIAGNOSIS — R0600 Dyspnea, unspecified: Secondary | ICD-10-CM | POA: Diagnosis present

## 2015-09-17 ENCOUNTER — Ambulatory Visit: Payer: Medicare Other | Admitting: Internal Medicine

## 2015-09-25 ENCOUNTER — Telehealth (HOSPITAL_COMMUNITY): Payer: Self-pay

## 2015-09-25 NOTE — Telephone Encounter (Signed)
Encounter complete. 

## 2015-09-27 ENCOUNTER — Ambulatory Visit (HOSPITAL_COMMUNITY)
Admission: RE | Admit: 2015-09-27 | Discharge: 2015-09-27 | Disposition: A | Discharge status: Home / Self Care | Payer: Medicare Other | Source: Ambulatory Visit | Attending: Cardiology | Admitting: Cardiology

## 2015-09-27 DIAGNOSIS — Z6833 Body mass index (BMI) 33.0-33.9, adult: Secondary | ICD-10-CM | POA: Diagnosis not present

## 2015-09-27 DIAGNOSIS — R600 Localized edema: Secondary | ICD-10-CM | POA: Insufficient documentation

## 2015-09-27 DIAGNOSIS — R0609 Other forms of dyspnea: Secondary | ICD-10-CM | POA: Diagnosis not present

## 2015-09-27 DIAGNOSIS — R42 Dizziness and giddiness: Secondary | ICD-10-CM | POA: Insufficient documentation

## 2015-09-27 DIAGNOSIS — R0602 Shortness of breath: Secondary | ICD-10-CM

## 2015-09-27 DIAGNOSIS — R5383 Other fatigue: Secondary | ICD-10-CM | POA: Insufficient documentation

## 2015-09-27 DIAGNOSIS — Z8249 Family history of ischemic heart disease and other diseases of the circulatory system: Secondary | ICD-10-CM | POA: Diagnosis not present

## 2015-09-27 DIAGNOSIS — R079 Chest pain, unspecified: Secondary | ICD-10-CM | POA: Insufficient documentation

## 2015-09-27 DIAGNOSIS — I10 Essential (primary) hypertension: Secondary | ICD-10-CM | POA: Insufficient documentation

## 2015-09-27 DIAGNOSIS — Z87891 Personal history of nicotine dependence: Secondary | ICD-10-CM | POA: Insufficient documentation

## 2015-09-27 DIAGNOSIS — E669 Obesity, unspecified: Secondary | ICD-10-CM | POA: Insufficient documentation

## 2015-09-27 DIAGNOSIS — R002 Palpitations: Secondary | ICD-10-CM | POA: Diagnosis not present

## 2015-09-27 LAB — MYOCARDIAL PERFUSION IMAGING
CHL CUP RESTING HR STRESS: 60 {beats}/min
LVDIAVOL: 54 mL (ref 46–106)
LVSYSVOL: 11 mL
NUC STRESS TID: 0.99
Peak HR: 94 {beats}/min
SDS: 1
SRS: 0
SSS: 1

## 2015-09-27 MED ORDER — AMINOPHYLLINE 25 MG/ML IV SOLN
75.0000 mg | Freq: Once | INTRAVENOUS | Status: AC
  Administered 2015-09-27: 75 mg via INTRAVENOUS

## 2015-09-27 MED ORDER — TECHNETIUM TC 99M SESTAMIBI GENERIC - CARDIOLITE
10.5000 | Freq: Once | INTRAVENOUS | Status: AC | PRN
  Administered 2015-09-27: 10.5 via INTRAVENOUS

## 2015-09-27 MED ORDER — REGADENOSON 0.4 MG/5ML IV SOLN
0.4000 mg | Freq: Once | INTRAVENOUS | Status: AC
  Administered 2015-09-27: 0.4 mg via INTRAVENOUS

## 2015-09-27 MED ORDER — TECHNETIUM TC 99M SESTAMIBI GENERIC - CARDIOLITE
31.0000 | Freq: Once | INTRAVENOUS | Status: AC | PRN
  Administered 2015-09-27: 31 via INTRAVENOUS

## 2015-10-08 ENCOUNTER — Encounter: Payer: Self-pay | Admitting: Internal Medicine

## 2015-10-08 ENCOUNTER — Ambulatory Visit (INDEPENDENT_AMBULATORY_CARE_PROVIDER_SITE_OTHER): Payer: Medicare Other | Admitting: Internal Medicine

## 2015-10-08 VITALS — BP 138/72 | HR 83 | Ht 62.0 in | Wt 182.0 lb

## 2015-10-08 DIAGNOSIS — I10 Essential (primary) hypertension: Secondary | ICD-10-CM | POA: Diagnosis not present

## 2015-10-08 DIAGNOSIS — R002 Palpitations: Secondary | ICD-10-CM | POA: Diagnosis not present

## 2015-10-08 DIAGNOSIS — R05 Cough: Secondary | ICD-10-CM | POA: Diagnosis not present

## 2015-10-08 DIAGNOSIS — R059 Cough, unspecified: Secondary | ICD-10-CM

## 2015-10-08 DIAGNOSIS — R0600 Dyspnea, unspecified: Secondary | ICD-10-CM

## 2015-10-08 DIAGNOSIS — R0602 Shortness of breath: Secondary | ICD-10-CM

## 2015-10-08 DIAGNOSIS — R0609 Other forms of dyspnea: Secondary | ICD-10-CM

## 2015-10-08 MED ORDER — METOPROLOL SUCCINATE ER 50 MG PO TB24: 50.0000 mg | ORAL_TABLET | Freq: Every day | ORAL | Status: DC

## 2015-10-08 MED ORDER — SPIRONOLACTONE 50 MG PO TABS: 50.0000 mg | ORAL_TABLET | Freq: Every day | ORAL | Status: DC

## 2015-10-08 NOTE — Progress Notes (Signed)
OFFICE NOTE  Chief Complaint:  Cough, dyspnea  Primary Care Physician: Salena Saner., MD  HPI:  Amanda Bender is a pleasant 75 year old female with a number of medical problems. 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 Apparently she also has a history of murmur since she was a child and was once diagnosed with mitral valve prolapse in the past the. Her family history is significant mostly for cancer and some heart disease in her father. She was referred for evaluation of lower extremity swelling earlier in the year, for which he says is been occurring over the past several months to a year. The swelling worsens when she is on her feet and improves after elevating them at night. There is no history of varicose veins in the family and she has never been diagnosed with in the past. She does have some neuropathic type pain including pain in both heels, which is yet to have evaluated. She says is painful when she walks especially bearing weight on her heels. She is on a medications that are typically associated with lower extremity swelling. She is also not on a diuretic. Imaging was performed which was negative for any venous insufficiency, and she was diagnosed with lymphedema.   She was now referred back for evaluation of palpitations. She reports a recently she's been having an increase in fatigue, shortness of breath, especially when walking up stairs and associated palpitations. At times she feels like her heart races. She's tried coughing and bearing down to stop this but it has no effect on it. The episodes occur almost on a daily basis. She had previously seen Dr. Einar Gip and had worn a monitor for some period of time which apparently was unrevealing. She also reports chronic fatigue symptoms as well as myofascial pain which occurs in her bilateral upper arms and upper trapezius and lower  cervical spine areas as well as her legs. She reports episodes of paroxysmal pain, shortness of breath fatigue and rapid heart rate which could be signs of fibromyalgia.  Finally she reports palpitations that have improved with self treatment. She increased her metoprolol tartrate from once daily 50 mg to 50 mg in the morning and 25 mg at night. Then she started to increase it to 50 mg twice daily. She is currently taking this dose, but reports that in the early afternoon or early morning that her palpitations do "breakthrough".  Amanda Bender returns today for followup of her echocardiogram and monitor. The echocardiogram did demonstrate mild mitral regurgitation, grade 1 diastolic dysfunction and an EF of 65-70%. No wall motion abnormalities. Her monitor did demonstrate PVCs which were isolated and seem to come at various times throughout the day. There was no significant nonsustained ventricular tachycardia or other worrisome rhythms. She continues to complain of palpitations and shortness of breath with activities that previously did not bother her. Her course has been more weight gain recently as well. She continues to deny any chest pain.  Amanda Bender did undergo a metabolic test. This demonstrated a less than optimal effort with a decreased VO2 of 74%.  Peak heart rate was 68% predicted, and there was flattening of the heart rate and VO2 curves. There is a sharp rise in VO2 during free pedaling suggesting that her obesity is contributing to some of her shortness of breath. In addition, the flattening of her heart rate her suggest an element of medication related chronotropic  incompetence.  Unfortunately, she feels that she needs higher doses of beta blocker to suppress her PVCs. In fact she reports that her palpitations are improved on higher dose beta blocker and that it is actually improving her shortness of breath.  On followup today Amanda Bender has no new complaints. Her shortness of breath  is stable. Her leg swelling is still present but perhaps slightly better. She did not have a significant palpitations except when she does not get good rest.  I saw Amanda Bender back today in the office. She actually has numerous complaints. Her last exam was a couple of years ago. Today she is presenting with palpitations, worsening constant shortness of breath with minimal exertion, chest pressure, leg swelling, and has reported left temporal headache which is intermittent and not associated with vision changes. Her shortness of breath tends to be with minimal exertion and has been progressive over the past couple of months. Prior to this she had an upper respiratory infection and feels like she "has not completely gotten over it".  10/08/2015  Amanda Bender returns today for follow-up. She reports some mild improvement in shortness of breath. Her weight is down 5 pounds with the increase in Aldactone to 50 mg daily. She says her swelling is resolved. Her BNP was low at 80. Renal function is stable and potassium is normal. She continues to have some shortness of breath however and cough. In the past she had seen Dr. Lanny Hurst clamps. I advised her that he is no longer with with our pulmonary, but she would like a referral back to pulmonary for reevaluation. Blood pressure appears well controlled today. Her nuclear stress test was low risk with an EF of 80% and no evidence of ischemia. She still gets occasional temporal headaches which may be cluster headaches. Her sedimentation rate was low, not being suggestive of temporal arteritis.  PMHx:  Past Medical History  Diagnosis Date  . Hypertension   . Thyroid disease   . Mitral prolapse     Past Surgical History  Procedure Laterality Date  . Abdominal hysterectomy    . Laparoscopy for ectopic pregnancy    . Bunionectomy      FAMHx:  Family History  Problem Relation Age of Onset  . Cancer Maternal Uncle   . Cancer Maternal Grandfather      SOCHx:   reports that she has quit smoking. She has never used smokeless tobacco. She reports that she does not drink alcohol or use illicit drugs.  ALLERGIES:  Allergies  Allergen Reactions  . Iohexol      Code: HIVES, Desc: pt states hives w/IVP dye several yrs ago, pt premedicated w/50mg  benadryl 1 hr prior to scan per Dr.Stroud and pt was fine   . Sulfonamide Derivatives     ROS: A comprehensive review of systems was negative except for: Constitutional: positive for fatigue Respiratory: positive for dyspnea on exertion Cardiovascular: positive for lower extremity edema and palpitations Musculoskeletal: positive for myalgias Neurological: positive for headaches Behavioral/Psych: positive for anxiety  HOME MEDS: Current Outpatient Prescriptions  Medication Sig Dispense Refill  . Acetaminophen (TYLENOL PO) Take 1 tablet by mouth as needed.    Amanda Bender THYROID PO Take 60 mg by mouth every other day.     Marland Kitchen aspirin 81 MG tablet Take 81 mg by mouth daily.     . B Complex-Biotin-FA (B-COMPLEET-100) TABS Take 1 tablet by mouth daily.    Marland Kitchen DIOVAN 320 MG tablet Take 1 tablet by mouth daily.     Marland Kitchen  fluticasone (FLONASE) 50 MCG/ACT nasal spray Place 2 sprays into both nostrils daily.    Marland Kitchen MAGNESIUM GLYCINATE PLUS PO Take 300 mg by mouth daily.    . metoprolol succinate (TOPROL-XL) 50 MG 24 hr tablet Take 1 tablet (50 mg total) by mouth daily. 90 tablet 3  . ranitidine (ZANTAC) 150 MG tablet Take 150 mg by mouth as needed for heartburn.    . spironolactone (ALDACTONE) 50 MG tablet Take 1 tablet (50 mg total) by mouth daily. 30 tablet 6  . valACYclovir (VALTREX) 500 MG tablet Take 500 mg by mouth daily.     No current facility-administered medications for this visit.    LABS/IMAGING: No results found for this or any previous visit (from the past 48 hour(s)). No results found.  VITALS: BP 138/72 mmHg  Pulse 83  Ht 5\' 2"  (1.575 m)  Wt 182 lb (82.555 kg)  BMI 33.28  kg/m2  EXAM: Deferred  EKG: Deferred  ASSESSMENT: 1. Progressive DOE - normal nuclear stress test with EF 80% 2. Mitral murmur 3. Frequent palpitaitons 4. Fatigue/chronic myofascial pain 5. Left temporal headache 6. Chest pressure  PLAN: 1.   Ms. Stanbro has had some improvement in her lower extremity swelling. Her BNP is low and does not suggest congestive heart failure. She seems to be tolerating the increased dose of Aldactone. Her nuclear stress test was negative for ischemia with normal LV function. Sedimentation rate was low and not consistent with temporal arteritis. Her headaches may be cluster headaches. She continues to have some mild dyspnea and cough. I like to refer her back to pulmonary for reevaluation. She previously saw Dr. Gwenette Greet however he's no longer with the [place] [facility]. We will referred to Dr. Lake Bells.  Follow-up in 6 months.  Pixie Casino, MD, [facility] Attending Cardiologist [place] 10/08/2015, 11:14 AM

## 2015-10-08 NOTE — Patient Instructions (Addendum)
Your physician wants you to follow-up in: 6 months with Dr. Debara Pickett. You will receive a reminder letter in the mail two months in advance. If you don't receive a letter, please call our office to schedule the follow-up appointment.  You have been referred to Dr. Lake Bells with [facility]

## 2015-11-08 ENCOUNTER — Encounter: Payer: Self-pay | Admitting: Pulmonary Disease

## 2015-11-08 ENCOUNTER — Ambulatory Visit (INDEPENDENT_AMBULATORY_CARE_PROVIDER_SITE_OTHER): Payer: Medicare Other | Admitting: Pulmonary Disease

## 2015-11-08 ENCOUNTER — Ambulatory Visit (INDEPENDENT_AMBULATORY_CARE_PROVIDER_SITE_OTHER)
Admission: RE | Admit: 2015-11-08 | Discharge: 2015-11-08 | Disposition: A | Discharge status: Home / Self Care | Payer: Medicare Other | Source: Ambulatory Visit | Attending: Pulmonary Disease | Admitting: Pulmonary Disease

## 2015-11-08 VITALS — BP 124/68 | HR 65 | Ht 62.0 in | Wt 185.2 lb

## 2015-11-08 DIAGNOSIS — R0600 Dyspnea, unspecified: Secondary | ICD-10-CM

## 2015-11-08 DIAGNOSIS — R0981 Nasal congestion: Secondary | ICD-10-CM

## 2015-11-08 DIAGNOSIS — R0609 Other forms of dyspnea: Secondary | ICD-10-CM | POA: Diagnosis not present

## 2015-11-08 DIAGNOSIS — R059 Cough, unspecified: Secondary | ICD-10-CM

## 2015-11-08 DIAGNOSIS — R05 Cough: Secondary | ICD-10-CM | POA: Diagnosis not present

## 2015-11-08 DIAGNOSIS — K219 Gastro-esophageal reflux disease without esophagitis: Secondary | ICD-10-CM | POA: Insufficient documentation

## 2015-11-08 MED ORDER — BENZONATATE 200 MG PO CAPS: 200.0000 mg | ORAL_CAPSULE | Freq: Three times a day (TID) | ORAL | Status: DC | PRN
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
## 2015-11-08 NOTE — Assessment & Plan Note (Signed)
She has had persistent cough for several months now after a severe respiratory infection in November.  While I believe that the majority of her cough is coming from an irritable larynx due to uncontrolled acid reflux and ongoing postnasal drip from chronic sinusitis, her increasing dyspnea over the last year raises concern for the possibility of a pulmonary process as well.  Currently her acid reflux and postnasal drip or poorly controlled.  Plan: She was given advice on gastroesophageal reflux disease, see below She was given advice on acid reflux, see below She was encouraged to rest her voice for 3 solid days and use Tessalon to help with the cough Chest x-ray ordered today Follow-up 3 weeks

## 2015-11-08 NOTE — Assessment & Plan Note (Signed)
She has had a fairly extensive workup for dyspnea in the past including cardiopulmonary exercise test in 2015 which showed low VO2 max. In general though there is no significant evidence of underlying pulmonary limitations. The test was determined indeterminate for myocardial dysfunction. There is some resting bradycardia noted.  What concerns me is that her shortness of breath has been progressing over the last year. Today her O2 saturation on ambulation was normal. So it's unclear if this time if she has developed a new pulmonary process.  Plan: Start with Chest x-ray today Treatment cough, after that he pulmonary function testing in about 3 weeks Follow-up in 3 weeks after PFT, decided that point if further pulmonary imaging is needed

## 2015-11-08 NOTE — Assessment & Plan Note (Signed)
She was advised the following:  Use Neil Med rinses with distilled water at least twice per day using the instructions on the package. 1/2 hour after using the [facility] Med rinse, use Nasacort two puffs in each nostril once per day.  Remember that the Nasacort can take 1-2 weeks to work after regular use. Use generic zyrtec (cetirizine) every day.  If this doesn't help, then stop taking it and use chlorpheniramine-phenylephrine combination tablets.

## 2015-11-08 NOTE — Assessment & Plan Note (Signed)
This has been poorly controlled recently.  Plan: She was counseled today on appropriate diet and lifestyle modifications to help improve gastroesophageal reflux She is advised to take Prilosec during the day and Zantac at night

## 2015-11-08 NOTE — Patient Instructions (Signed)
For your cough I want you to treat your GERD and sinus disease as below and rest your voice as detailed below:  GERD: Follow the acid reflux lifestyle modification sheet we provided you Take Prilosec 20 mg in the morning and Zantac in the evenings before bed  For your sinuses I recommend the following: Use Neil Med rinses with distilled water at least twice per day using the instructions on the package. 1/2 hour after using the [facility] Med rinse, use Nasacort two puffs in each nostril once per day.  Remember that the Nasacort can take 1-2 weeks to work after regular use. Use generic zyrtec (cetirizine) every day.  If this doesn't help, then stop taking it and use chlorpheniramine-phenylephrine combination tablets.  You need to try to suppress your cough to allow your larynx (voice box) to heal.  For three days don't talk, laugh, sing, or clear your throat. Do everything you can to suppress the cough during this time. Use hard candies (sugarless Jolly Ranchers) or non-mint or non-menthol containing cough drops during this time to soothe your throat.  Use a cough suppressant (Delsym or what I have prescribed you) around the clock during this time.  After three days, gradually increase the use of your voice and back off on the cough suppressants.  We will call you with the results of today's chest x-ray  We will see you back in about 3 weeks and then get lung function testing at that point.

## 2015-11-14 ENCOUNTER — Encounter (HOSPITAL_COMMUNITY): Payer: Medicare Other

## 2015-11-27 ENCOUNTER — Encounter (HOSPITAL_COMMUNITY): Payer: Medicare Other

## 2015-11-28 ENCOUNTER — Ambulatory Visit (HOSPITAL_COMMUNITY)
Admission: RE | Admit: 2015-11-28 | Discharge: 2015-11-28 | Disposition: A | Discharge status: Home / Self Care | Payer: Medicare Other | Source: Ambulatory Visit | Attending: Pulmonary Disease | Admitting: Pulmonary Disease

## 2015-11-28 ENCOUNTER — Ambulatory Visit (INDEPENDENT_AMBULATORY_CARE_PROVIDER_SITE_OTHER): Payer: Medicare Other | Admitting: Acute Care

## 2015-11-28 ENCOUNTER — Telehealth: Payer: Self-pay | Admitting: Pulmonary Disease

## 2015-11-28 ENCOUNTER — Encounter: Payer: Self-pay | Admitting: Acute Care

## 2015-11-28 VITALS — BP 110/58 | HR 69 | Temp 97.0°F | Ht 62.5 in | Wt 183.2 lb

## 2015-11-28 DIAGNOSIS — R0981 Nasal congestion: Secondary | ICD-10-CM

## 2015-11-28 DIAGNOSIS — R0600 Dyspnea, unspecified: Secondary | ICD-10-CM

## 2015-11-28 DIAGNOSIS — R05 Cough: Secondary | ICD-10-CM

## 2015-11-28 DIAGNOSIS — R059 Cough, unspecified: Secondary | ICD-10-CM

## 2015-11-28 DIAGNOSIS — R0609 Other forms of dyspnea: Secondary | ICD-10-CM | POA: Insufficient documentation

## 2015-11-28 LAB — PULMONARY FUNCTION TEST
DL/VA % pred: 96 %
DL/VA: 4.39 ml/min/mmHg/L
DLCO UNC % PRED: 67 %
DLCO UNC: 14.64 ml/min/mmHg
FEF 25-75 PRE: 1.76 L/s
FEF 25-75 Post: 1.4 L/sec
FEF2575-%Change-Post: -20 %
FEF2575-%PRED-POST: 99 %
FEF2575-%PRED-PRE: 124 %
FEV1-%Change-Post: -5 %
FEV1-%Pred-Post: 101 %
FEV1-%Pred-Pre: 107 %
FEV1-Post: 1.59 L
FEV1-Pre: 1.68 L
FEV1FVC-%Change-Post: 3 %
FEV1FVC-%Pred-Pre: 107 %
FEV6-%CHANGE-POST: -8 %
FEV6-%PRED-POST: 96 %
FEV6-%Pred-Pre: 105 %
FEV6-Post: 1.87 L
FEV6-Pre: 2.03 L
FEV6FVC-%Pred-Post: 105 %
FEV6FVC-%Pred-Pre: 105 %
FVC-%CHANGE-POST: -8 %
FVC-%PRED-POST: 91 %
FVC-%Pred-Pre: 100 %
FVC-Post: 1.87 L
FVC-Pre: 2.03 L
POST FEV1/FVC RATIO: 85 %
Post FEV6/FVC ratio: 100 %
Pre FEV1/FVC ratio: 82 %
Pre FEV6/FVC Ratio: 100 %
RV % pred: 41 %
RV: 0.9 L
TLC % pred: 73 %
TLC: 3.47 L

## 2015-11-28 MED ORDER — ALBUTEROL SULFATE (2.5 MG/3ML) 0.083% IN NEBU
2.5000 mg | INHALATION_SOLUTION | Freq: Once | RESPIRATORY_TRACT | Status: AC
  Administered 2015-11-28: 2.5 mg via RESPIRATORY_TRACT

## 2015-11-28 NOTE — Telephone Encounter (Signed)
Doesn't need immediate work in unless something abnormal seen on CT that requires faster attention

## 2015-11-28 NOTE — Patient Instructions (Addendum)
It is nice to meet you today. Continue the regimen for GERD Continue your regimen for sinuses/ allergies Sips of water instead of throat clearing Avoid menthol, mint, and chocolate. Try not to eat 3 hours before going to bed. Sugar Free Jolly Ranchers/ Lemonhead hard candies for throat soothing. We will schedule you for a CT Chest to further evaluate your shortness of breath. Follow up with Dr. Lake Bells after the CT is done to go over the results. Please contact office for sooner follow up if symptoms do not improve or worsen or seek emergency care

## 2015-11-28 NOTE — Assessment & Plan Note (Signed)
Improved after following GERD regimen: Following GERD Diet: Plan: Continue regimen including use of PPI and Zantac Sips of water instead of throat clearing Avoid menthol, mint, and chocolate. Try not to eat 3 hours before going to bed. Sugar Free Jolly Ranchers/ Lemonhead hard candies for throat soothing. Follow up with Dr. Lake Bells after CT chest Please contact office for sooner follow up if symptoms do not improve or worsen or seek emergency care

## 2015-11-28 NOTE — Assessment & Plan Note (Signed)
Continued DOE after extensive work up, including potential cardiac etiologies. She states she is SOB with minimal exertion and this has worsened over the past year. PFT's 11/28/2015  FVC: 2.03/ 100% FEV1: 1.68 ( 107%) F?F: 82 ( 103%) TLC: 3.47 ( 73%) RV: .90 ( 41%) DLCO: 14.64 ( 67%)  Minimal Restriction- Interstitial Mild diffusion deficit Crackles with auscultation per bases.  Plan: HRCT Chest to further evaluate dyspnea. Follow up with Dr. Lake Bells after HRCT done.

## 2015-11-28 NOTE — Progress Notes (Signed)
History of Present Illness Amanda Bender is a 75 y.o. female with    11/28/2015: Follow up OV: Pt. Was seen by Dr. Lake Bells 11/08/2015. She was seen for dyspnea on exertion, persistent cough and GERD. She has implemented the interventions that Dr. Lake Bells suggested including addition of following the GERD Diet, compliance with Prilosec during the day and Zantac at night, Using  Milta Deiters Med rinses with distilled water at least twice per day 1/2 hour after using the [facility] rinse, she is using Nasacort two puffs in each nostril once per day, she has also added generic zyrtec (cetirizine) every day.She states her cough is better, but her dyspnea is no better. She is still short of breath with walking and climbing stairs. She is only using the [employer] inhaler with wheezing. She states she has only had to use it about 5 times in the last 3 weeks.  Tests 11/28/2014:   PFT's: FVC: 2.03/ 100% FEV1: 1.68 ( 107%) F/F: 82 ( 103%) TLC: 3.47 ( 73%) RV: .90 ( 41%) DLCO: 14.64 ( 67%)  CXR: 11/08/2015: IMPRESSION: 1. Mild right base and right mid lung subsegmental atelectasis.  2. Mild cardiomegaly. No CHF.  Minimal Restriction- Interstitial Mild diffusion deficit   Past medical hx Past Medical History  Diagnosis Date  . Hypertension   . Thyroid disease   . Mitral prolapse      Past surgical hx, Family hx, Social hx all reviewed.  Current Outpatient Prescriptions on File Prior to Visit  Medication Sig  . Acetaminophen (TYLENOL PO) Take 1 tablet by mouth as needed.  Francia Greaves THYROID PO Take 60 mg by mouth every other day.   Marland Kitchen aspirin 81 MG tablet Take 81 mg by mouth daily.   . B Complex-Biotin-FA (B-COMPLEET-100) TABS Take 1 tablet by mouth daily.  . benzonatate (TESSALON) 200 MG capsule Take 1 capsule (200 mg total) by mouth 3 (three) times daily as needed for cough.  . DIOVAN 320 MG tablet Take 1 tablet by mouth daily.   . fluticasone (FLONASE) 50 MCG/ACT nasal spray Place 2 sprays  into both nostrils daily.  Marland Kitchen MAGNESIUM GLYCINATE PLUS PO Take 300 mg by mouth daily.  . metoprolol succinate (TOPROL-XL) 50 MG 24 hr tablet Take 1 tablet (50 mg total) by mouth daily.  . ranitidine (ZANTAC) 150 MG tablet Take 150 mg by mouth as needed for heartburn.  . spironolactone (ALDACTONE) 50 MG tablet Take 1 tablet (50 mg total) by mouth daily.  . valACYclovir (VALTREX) 500 MG tablet Take 500 mg by mouth daily.   No current facility-administered medications on file prior to visit.     Allergies  Allergen Reactions  . Iohexol      Code: HIVES, Desc: pt states hives w/IVP dye several yrs ago, pt premedicated w/50mg  benadryl 1 hr prior to scan per Dr.Stroud and pt was fine   . Sulfonamide Derivatives     Review Of Systems:  Constitutional:   No  weight loss, night sweats,  Fevers, chills, fatigue, or  lassitude.  HEENT:   No headaches,  Difficulty swallowing,  Tooth/dental problems, or  Sore throat,                No sneezing, itching, ear ache, nasal congestion, post nasal drip,   CV:  No chest pain,  Orthopnea, PND, swelling in lower extremities, anasarca, dizziness, palpitations, syncope.   GI  No heartburn, indigestion, abdominal pain, nausea, vomiting, diarrhea, change in bowel habits, loss of  appetite, bloody stools.   Resp: + shortness of breath with exertion or at rest.  + excess mucus, no productive cough,  +, but improved  non-productive cough,  No coughing up of blood.  No change in color of mucus.  + wheezing.  No chest wall deformity, + congestion  Skin: no rash or lesions.  GU: no dysuria, change in color of urine, no urgency or frequency.  No flank pain, no hematuria   MS:  No joint pain or swelling.  No decreased range of motion.  No back pain.  Psych:  No change in mood or affect. No depression or anxiety.  No memory loss.   Vital Signs BP 110/58 mmHg  Pulse 69  Temp(Src) 97 F (36.1 C) (Oral)  Ht 5' 2.5" (1.588 m)  Wt 183 lb 3.2 oz (83.099 kg)  BMI  32.95 kg/m2  SpO2 98%   Physical Exam:  General- No distress,  A&Ox3, pleasant ENT: No sinus tenderness, TM clear, pale nasal mucosa, no oral exudate,+ post nasal drip, no LAN Cardiac: S1, S2, regular rate and rhythm, no murmur Chest: No wheeze/+ crackles / no dullness; no accessory muscle use, no nasal flaring, no sternal retractions Abd.: Soft Non-tender Ext: No clubbing cyanosis, edema Neuro:  normal strength Skin: No rashes, warm and dry Psych: normal mood and behavior   Assessment/Plan  DOE (dyspnea on exertion) Continued DOE after extensive work up, including potential cardiac etiologies. She states she is SOB with minimal exertion and this has worsened over the past year. PFT's 11/28/2015  FVC: 2.03/ 100% FEV1: 1.68 ( 107%) F?F: 82 ( 103%) TLC: 3.47 ( 73%) RV: .90 ( 41%) DLCO: 14.64 ( 67%)  Minimal Restriction- Interstitial Mild diffusion deficit Crackles with auscultation per bases.  Plan: HRCT Chest to further evaluate dyspnea. Follow up with Dr. Lake Bells after HRCT done.   Cough Improved after following GERD regimen: Following GERD Diet: Plan: Continue regimen including use of PPI and Zantac Sips of water instead of throat clearing Avoid menthol, mint, and chocolate. Try not to eat 3 hours before going to bed. Sugar Free Jolly Ranchers/ Lemon[place] hard candies for throat soothing. Follow up with Dr. Lake Bells after CT chest Please contact office for sooner follow up if symptoms do not improve or worsen or seek emergency care    Sinus congestion Improving on the following regimen: Plan: Continue following regimen: Use Neil Med rinses with distilled water at least twice per day using the instructions on the package. 1/2 hour after using the [facility] Med rinse, use Nasacort two puffs in each nostril once per day. Remember that the Nasacort can take 1-2 weeks to work after regular use. Use generic zyrtec (cetirizine) every day. If this doesn't help, then stop  taking it and use chlorpheniramine-phenylephrine combination tablets      Magdalen Spatz, NP 11/28/2015  3:15 PM

## 2015-11-28 NOTE — Assessment & Plan Note (Addendum)
Improving on the following regimen: Plan: Continue following regimen: Use Neil Med rinses with distilled water at least twice per day using the instructions on the package. 1/2 hour after using the [facility] Med rinse, use Nasacort two puffs in each nostril once per day. Remember that the Nasacort can take 1-2 weeks to work after regular use. Use generic zyrtec (cetirizine) every day. If this doesn't help, then stop taking it and use chlorpheniramine-phenylephrine combination tablets

## 2015-11-28 NOTE — Telephone Encounter (Signed)
Pt is scheduled for CT scan 11/30/15 and was advised at last OV with SG to follow up with BQ once complete.  Nothing available until 01/08/2016. Please advise Dr Lake Bells where we can work the patient in on your schedule or if okay to follow up with SG again to review results. Thanks.

## 2015-11-28 NOTE — Telephone Encounter (Signed)
Spoke with pt,aware of recs.  Nothing further needed.  

## 2015-11-28 NOTE — Progress Notes (Signed)
Reviewed, agree with above.  Notably, PFT's are actually improved compared to 2015 values, so hopefully nothing significant will be seen on the CT scan.

## 2015-11-30 ENCOUNTER — Ambulatory Visit (INDEPENDENT_AMBULATORY_CARE_PROVIDER_SITE_OTHER)
Admission: RE | Admit: 2015-11-30 | Discharge: 2015-11-30 | Disposition: A | Discharge status: Home / Self Care | Payer: Medicare Other | Source: Ambulatory Visit | Attending: Acute Care | Admitting: Acute Care

## 2015-11-30 DIAGNOSIS — R0600 Dyspnea, unspecified: Secondary | ICD-10-CM

## 2015-11-30 DIAGNOSIS — R0609 Other forms of dyspnea: Secondary | ICD-10-CM

## 2015-12-12 ENCOUNTER — Telehealth: Payer: Self-pay | Admitting: Acute Care

## 2015-12-12 NOTE — Telephone Encounter (Signed)
I have called the patient the results of her CT chest. She verbalized understanding. All questions were answered prior to completion of the call.She has the office contact information in the event she has any further questions.

## 2016-01-08 ENCOUNTER — Ambulatory Visit (INDEPENDENT_AMBULATORY_CARE_PROVIDER_SITE_OTHER): Payer: Medicare Other | Admitting: Pulmonary Disease

## 2016-01-08 ENCOUNTER — Encounter: Payer: Self-pay | Admitting: Pulmonary Disease

## 2016-01-08 DIAGNOSIS — K219 Gastro-esophageal reflux disease without esophagitis: Secondary | ICD-10-CM | POA: Diagnosis not present

## 2016-01-08 DIAGNOSIS — J479 Bronchiectasis, uncomplicated: Secondary | ICD-10-CM

## 2016-01-08 MED ORDER — FLUTTER DEVI: 0 refills | Status: AC

## 2016-01-08 NOTE — Patient Instructions (Signed)
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  For the cough related to acid reflux: Start taking Prilosec every other day rather than every day, on the Prilosec "off days" use Zantac 150 mg instead in the morning. After 7 days transition to Zantac twice a day  For the bronchiectasis: When you are ill, specifically if you have more chest congestion and are producing more mucus you need to call our office sooner rather than later for a course of antibiotics. Also, during that time you need to use a flutter valve 4-5 breaths, 4-5 times a day  We will see you back in 6 months or sooner if needed

## 2016-01-08 NOTE — Assessment & Plan Note (Signed)
Today we reviewed the images from her CT chest together in clinic which showed right upper lobe only bronchiectasis and a calcified granuloma in the right lower lobe. 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  This is also an explanation for the recurrent infections she's had in her chest over the years. Fortunately, it does not seem to have progressed significantly nor is it reflective of a systemic process given the lack of progression between the 2011 and 2017 CT scans of the chest.'  Plan: She was counseled today on this importance of taking antibiotics when she has bronchitis When she has bronchitis she should use a flutter valve 4-5 breaths, 4-5 times a day, this was given to her today No indication for bronchodilators at this time Get a flu shot in the fall She is counseled on the importance of handwashing particularly in the high respiratory viral times of the year. Follow-up 6 months

## 2016-01-08 NOTE — Assessment & Plan Note (Signed)
I believe that her cough is primarily due to acid reflux this time as she says the "hacking" portion of the cough has nearly completely resolved.  She is concerned about side effects from proton pump inhibitors. Today we reviewed lifestyle modification changes for gastroesophageal reflux disease.  Plan: Taper off of Prilosec Take Zantac twice a day Continue lifestyle modifications for gastroesophageal reflux disease

## 2016-01-08 NOTE — Progress Notes (Signed)
Subjective:    Patient ID: Amanda Bender, female    DOB: Jul 22, 1940, 75 y.o.   MRN: [id number]  Synopsis:This is a former patient of Dr. 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 CT sinuses in 2013 without pathology. Cardiopulmonary stress test forms in 2015 in [place] showed a VO2 max of 13 mL per K per minute low); minute ventilation at peak was 32 L/m which was less than 80% predicted capacity Pulmonary function testing 2015 as part of cardiopulmonary exercise test: Ratio 82%, FEV1 1.44 L (166% predicted), FVC 1.74 L (150% predicted), DLCO 13.79 (88% predicted) June 2017 pulmonary function testing ratio 85%, FEV1 1.59 L (101% predicted), FVC 2.03 L (100% predicted), total lung capacity 3.47 L (73% predicted), DLCO 14.64 (67% predicted). June 2017 CT scan of the chest showed right upper lobe bronchiectasis and evidence of granulomatous disease.  HPI Chief Complaint  Patient presents with  . Follow-up    Pt c/o occasional cough with clear mucus, continued SOB with exertion. Pt denies wheeze/CP/tightness. Pt would like to review CT results.     Overall she is feeling much better since last visit. She reports that she had the severe episode of "pneumonia" back in November 2016 but that has since completely resolved.  Cough: > has improved, the hack has significantly improved > she thinks that treating the GERD makes a difference > She will occasionally produce some clear mucus but this is cleared up significantly   GERD: > she reports having no Heartburn since taking the antacid therapy.  Shortness of breath: She reports that this is resolved, she does not have this at this time.    Past Medical History:  Diagnosis Date  . Hypertension   . Mitral prolapse   . Thyroid disease      Family History  Problem Relation Age of Onset  . Cancer Maternal Uncle   . Cancer  Maternal Grandfather   . Tuberculosis Mother   . Tuberculosis Brother   . Mesothelioma Brother      Social History   Social History  . Marital status: Single    Spouse name: N/A  . Number of children: N/A  . Years of education: N/A   Occupational History  . Not on file.   Social History Main Topics  . Smoking status: Former Smoker    Packs/day: 1.00    Years: 1.00    Types: Cigarettes    Quit date: 06/09/1961  . Smokeless tobacco: Never Used     Comment: social smoker- never a habit.    . Alcohol use No  . Drug use: No  . Sexual activity: Not on file   Other Topics Concern  . Not on file   Social History Narrative  . No narrative on file     Allergies  Allergen Reactions  . Iohexol      Code: HIVES, Desc: pt states hives w/IVP dye several yrs ago, pt premedicated w/50mg  benadryl 1 hr prior to scan per Dr.Stroud and pt was fine   . Sulfonamide Derivatives      Outpatient Medications Prior to Visit  Medication Sig Dispense Refill  . Acetaminophen (TYLENOL PO) Take 1 tablet by mouth as needed.    Francia Greaves THYROID PO Take 60 mg by mouth every other day.     Marland Kitchen aspirin 81 MG tablet Take 81 mg by mouth daily.     . B Complex-Biotin-FA (  B-COMPLEET-100) TABS Take 1 tablet by mouth daily.    Marland Kitchen DIOVAN 320 MG tablet Take 1 tablet by mouth daily.     . fluticasone (FLONASE) 50 MCG/ACT nasal spray Place 2 sprays into both nostrils daily.    Marland Kitchen MAGNESIUM GLYCINATE PLUS PO Take 300 mg by mouth daily.    . metoprolol succinate (TOPROL-XL) 50 MG 24 hr tablet Take 1 tablet (50 mg total) by mouth daily. 90 tablet 3  . omeprazole (PRILOSEC) 10 MG capsule Take 10 mg by mouth daily.    . ranitidine (ZANTAC) 150 MG tablet Take 150 mg by mouth as needed for heartburn.    . spironolactone (ALDACTONE) 50 MG tablet Take 1 tablet (50 mg total) by mouth daily. 30 tablet 6  . valACYclovir (VALTREX) 500 MG tablet Take 500 mg by mouth daily.    . benzonatate (TESSALON) 200 MG capsule Take 1  capsule (200 mg total) by mouth 3 (three) times daily as needed for cough. 30 capsule 1   No facility-administered medications prior to visit.        Review of Systems  Constitutional: Negative for fever and unexpected weight change.  HENT: Positive for congestion. Negative for dental problem, ear pain, nosebleeds, postnasal drip, rhinorrhea, sinus pressure, sneezing, sore throat and trouble swallowing.   Eyes: Negative for redness and itching.  Respiratory: Positive for cough, chest tightness and shortness of breath. Negative for wheezing.   Cardiovascular: Negative for palpitations and leg swelling.  Gastrointestinal: Negative for nausea and vomiting.  Genitourinary: Negative for dysuria.  Musculoskeletal: Negative for joint swelling.  Skin: Negative for rash.  Neurological: Negative for headaches.  Hematological: Does not bruise/bleed easily.  Psychiatric/Behavioral: Negative for dysphoric mood. The patient is not nervous/anxious.        Objective:   Physical Exam  Vitals:   01/08/16 1530  BP: 132/78  Pulse: 66  SpO2: 97%  Weight: 189 lb (85.7 kg)  Height: 5' 2.5" (1.588 m)   RA  Gen: well appearing HENT: OP clear, TM's clear, neck supple PULM: CTA B, normal percussion CV: RRR, no mgr, trace edema GI: BS+, soft, nontender Derm: no cyanosis or rash Psyche: normal mood and affect  June 2017 CT chest images personally reviewed, see my description below per     Assessment & Plan:  GERD (gastroesophageal reflux disease) I believe that her cough is primarily due to acid reflux this time as she says the "hacking" portion of the cough has nearly completely resolved.  She is concerned about side effects from proton pump inhibitors. Today we reviewed lifestyle modification changes for gastroesophageal reflux disease.  Plan: Taper off of Prilosec Take Zantac twice a day Continue lifestyle modifications for gastroesophageal reflux disease  Bronchiectasis without  acute exacerbation (HCC) Today we reviewed the images from her CT chest together in clinic which showed right upper lobe only bronchiectasis and a calcified granuloma in the right lower lobe. 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  This is also an explanation for the recurrent infections she's had in her chest over the years. Fortunately, it does not seem to have progressed significantly nor is it reflective of a systemic process given the lack of progression between the 2011 and 2017 CT scans of the chest.'  Plan: She was counseled today on this importance of taking antibiotics when she has bronchitis When she has bronchitis she should use a flutter valve 4-5 breaths, 4-5 times  a day, this was given to her today No indication for bronchodilators at this time Get a flu shot in the fall She is counseled on the importance of handwashing particularly in the high respiratory viral times of the year. Follow-up 6 months  > 50% spent face to face in a 35 minute visit   Current Outpatient Prescriptions:  .  Acetaminophen (TYLENOL PO), Take 1 tablet by mouth as needed., Disp: , Rfl:  .  ARMOUR THYROID PO, Take 60 mg by mouth every other day. , Disp: , Rfl:  .  aspirin 81 MG tablet, Take 81 mg by mouth daily. , Disp: , Rfl:  .  B Complex-Biotin-FA (B-COMPLEET-100) TABS, Take 1 tablet by mouth daily., Disp: , Rfl:  .  DIOVAN 320 MG tablet, Take 1 tablet by mouth daily. , Disp: , Rfl:  .  fluticasone (FLONASE) 50 MCG/ACT nasal spray, Place 2 sprays into both nostrils daily., Disp: , Rfl:  .  MAGNESIUM GLYCINATE PLUS PO, Take 300 mg by mouth daily., Disp: , Rfl:  .  metoprolol succinate (TOPROL-XL) 50 MG 24 hr tablet, Take 1 tablet (50 mg total) by mouth daily., Disp: 90 tablet, Rfl: 3 .  omeprazole (PRILOSEC) 10 MG capsule, Take 10 mg by mouth daily., Disp: , Rfl:  .  ranitidine (ZANTAC) 150 MG  tablet, Take 150 mg by mouth as needed for heartburn., Disp: , Rfl:  .  spironolactone (ALDACTONE) 50 MG tablet, Take 1 tablet (50 mg total) by mouth daily., Disp: 30 tablet, Rfl: 6 .  valACYclovir (VALTREX) 500 MG tablet, Take 500 mg by mouth daily., Disp: , Rfl:  .  Respiratory Therapy Supplies (FLUTTER) DEVI, Use as directed, Disp: 1 each, Rfl: 0

## 2016-01-09 ENCOUNTER — Telehealth: Payer: Self-pay | Admitting: Pulmonary Disease

## 2016-01-09 ENCOUNTER — Encounter: Payer: Self-pay | Admitting: Pulmonary Disease

## 2016-01-09 NOTE — Telephone Encounter (Signed)
lmomtcb x 1 for pt to find out which PNA vaccine that she did have.

## 2016-01-09 NOTE — Telephone Encounter (Signed)
Spoke with pt, states she gave the wrong info to BQ yesterday- pt had the prevnar last month, NOT the flu shot.  Pt has not yet had a flu shot this year.  Chart has been updated to reflect this.  Nothing further needed.

## 2016-03-14 ENCOUNTER — Ambulatory Visit: Payer: Medicare Other | Admitting: Internal Medicine

## 2016-04-14 ENCOUNTER — Telehealth: Payer: Self-pay | Admitting: Pulmonary Disease

## 2016-04-14 MED ORDER — DOXYCYCLINE HYCLATE 100 MG PO TABS
100.0000 mg | ORAL_TABLET | Freq: Two times a day (BID) | ORAL | 0 refills | Status: DC
Start: 2016-04-14 — End: 2016-05-07

## 2016-04-14 NOTE — Telephone Encounter (Signed)
Called and spoke to pt. Pt c/o increase in SOB, prod cough with little mucus production - unsure of color, chest tightness, chest congestion x 2 days. Pt denies f/c/s. Pt states she is using netti pot and is gargling warm salt water.   Dr. Lake Bells please advise. Thanks.

## 2016-04-14 NOTE — Telephone Encounter (Signed)
Spoke with pt and advised of Dr Anastasia Pall recommendations. Rx sent. Nothing further needed.

## 2016-04-14 NOTE — Telephone Encounter (Signed)
Doxycycline 100mg  po bid x 7 days Call if no improvement Use flutter valve 4-5 breaths 4-5 times per day

## 2016-05-07 ENCOUNTER — Encounter: Payer: Self-pay | Admitting: Internal Medicine

## 2016-05-07 ENCOUNTER — Ambulatory Visit (INDEPENDENT_AMBULATORY_CARE_PROVIDER_SITE_OTHER): Payer: Medicare Other | Admitting: Internal Medicine

## 2016-05-07 VITALS — BP 112/74 | HR 80 | Ht 62.5 in | Wt 184.4 lb

## 2016-05-07 DIAGNOSIS — R0609 Other forms of dyspnea: Secondary | ICD-10-CM | POA: Diagnosis not present

## 2016-05-07 DIAGNOSIS — R0600 Dyspnea, unspecified: Secondary | ICD-10-CM

## 2016-05-07 DIAGNOSIS — I059 Rheumatic mitral valve disease, unspecified: Secondary | ICD-10-CM

## 2016-05-07 DIAGNOSIS — I10 Essential (primary) hypertension: Secondary | ICD-10-CM | POA: Diagnosis not present

## 2016-05-07 NOTE — Patient Instructions (Signed)
Medication Instructions:  Your physician recommends that you continue on your current medications as directed. Please refer to the Current Medication list given to you today.  Labwork: None  Testing/Procedures: None   Follow-Up: Your physician wants you to follow-up in: 12 MONTHS WITH DR HILTY. You will receive a reminder letter in the mail two months in advance. If you don't receive a letter, please call our office to schedule the follow-up appointment.  Any Other Special Instructions Will Be Listed Below (If Applicable).     If you need a refill on your cardiac medications before your next appointment, please call your pharmacy.  

## 2016-05-07 NOTE — Progress Notes (Signed)
OFFICE NOTE  Chief Complaint:  Feel pretty well  Primary Care Physician: Salena Saner., MD  HPI:  Amanda Bender is a pleasant 75 year old female with a number of medical problems. 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 Apparently she also has a history of murmur since she was a child and was once diagnosed with mitral valve prolapse in the past the. Her family history is significant mostly for cancer and some heart disease in her father. She was referred for evaluation of lower extremity swelling earlier in the year, for which he says is been occurring over the past several months to a year. The swelling worsens when she is on her feet and improves after elevating them at night. There is no history of varicose veins in the family and she has never been diagnosed with in the past. She does have some neuropathic type pain including pain in both heels, which is yet to have evaluated. She says is painful when she walks especially bearing weight on her heels. She is on a medications that are typically associated with lower extremity swelling. She is also not on a diuretic. Imaging was performed which was negative for any venous insufficiency, and she was diagnosed with lymphedema.   She was now referred back for evaluation of palpitations. She reports a recently she's been having an increase in fatigue, shortness of breath, especially when walking up stairs and associated palpitations. At times she feels like her heart races. She's tried coughing and bearing down to stop this but it has no effect on it. The episodes occur almost on a daily basis. She had previously seen Dr. Einar Gip and had worn a monitor for some period of time which apparently was unrevealing. She also reports chronic fatigue symptoms as well as myofascial pain which occurs in her bilateral upper arms and upper trapezius and lower  cervical spine areas as well as her legs. She reports episodes of paroxysmal pain, shortness of breath fatigue and rapid heart rate which could be signs of fibromyalgia.  Finally she reports palpitations that have improved with self treatment. She increased her metoprolol tartrate from once daily 50 mg to 50 mg in the morning and 25 mg at night. Then she started to increase it to 50 mg twice daily. She is currently taking this dose, but reports that in the early afternoon or early morning that her palpitations do "breakthrough".  Ms. Cherek returns today for followup of her echocardiogram and monitor. The echocardiogram did demonstrate mild mitral regurgitation, grade 1 diastolic dysfunction and an EF of 65-70%. No wall motion abnormalities. Her monitor did demonstrate PVCs which were isolated and seem to come at various times throughout the day. There was no significant nonsustained ventricular tachycardia or other worrisome rhythms. She continues to complain of palpitations and shortness of breath with activities that previously did not bother her. Her course has been more weight gain recently as well. She continues to deny any chest pain.  Ms. Golis did undergo a metabolic test. This demonstrated a less than optimal effort with a decreased VO2 of 74%.  Peak heart rate was 68% predicted, and there was flattening of the heart rate and VO2 curves. There is a sharp rise in VO2 during free pedaling suggesting that her obesity is contributing to some of her shortness of breath. In addition, the flattening of her heart rate her suggest an element of medication related  chronotropic incompetence.  Unfortunately, she feels that she needs higher doses of beta blocker to suppress her PVCs. In fact she reports that her palpitations are improved on higher dose beta blocker and that it is actually improving her shortness of breath.  On followup today Ms. Haan has no new complaints. Her shortness of breath  is stable. Her leg swelling is still present but perhaps slightly better. She did not have a significant palpitations except when she does not get good rest.  I saw Ms. Stay back today in the office. She actually has numerous complaints. Her last exam was a couple of years ago. Today she is presenting with palpitations, worsening constant shortness of breath with minimal exertion, chest pressure, leg swelling, and has reported left temporal headache which is intermittent and not associated with vision changes. Her shortness of breath tends to be with minimal exertion and has been progressive over the past couple of months. Prior to this she had an upper respiratory infection and feels like she "has not completely gotten over it".  10/08/2015  Mrs. Maione returns today for follow-up. She reports some mild improvement in shortness of breath. Her weight is down 5 pounds with the increase in Aldactone to 50 mg daily. She says her swelling is resolved. Her BNP was low at 80. Renal function is stable and potassium is normal. She continues to have some shortness of breath however and cough. In the past she had seen Dr. Lanny Hurst clamps. I advised her that he is no longer with with our pulmonary, but she would like a referral back to pulmonary for reevaluation. Blood pressure appears well controlled today. Her nuclear stress test was low risk with an EF of 80% and no evidence of ischemia. She still gets occasional temporal headaches which may be cluster headaches. Her sedimentation rate was low, not being suggestive of temporal arteritis.  05/07/2016  Mrs. Helder returns for follow-up. She has seen Dr. Lake Bells with pulmonary in follow-up and he diagnosed her with bronchiectasis. 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 She was treated with antibiotics which she says helped her breathing, but now she is bothered by frequent incontinence. She denies any chest pain.  PMHx:  Past Medical History:    Diagnosis Date  . Hypertension   . Mitral prolapse   . Thyroid disease     Past Surgical History:  Procedure Laterality Date  . ABDOMINAL HYSTERECTOMY    . BUNIONECTOMY    . LAPAROSCOPY FOR ECTOPIC PREGNANCY      FAMHx:  Family History  Problem Relation Age of Onset  . Cancer Maternal Uncle   . Cancer Maternal Grandfather   . Tuberculosis Mother   . Tuberculosis Brother   . Mesothelioma Brother     SOCHx:   reports that she quit smoking about 54 years ago. Her smoking use included Cigarettes. She has a 1.00 pack-year smoking history. She has never used smokeless tobacco. She reports that she does not drink alcohol or use drugs.  ALLERGIES:  Allergies  Allergen Reactions  . Iohexol      Code: HIVES, Desc: pt states hives w/IVP dye several yrs ago, pt premedicated w/50mg  benadryl 1 hr prior to scan per Dr.Stroud and pt was fine   . Sulfonamide Derivatives     ROS: Pertinent items noted in HPI and remainder of comprehensive ROS otherwise negative.  HOME MEDS: Current Outpatient Prescriptions  Medication Sig Dispense Refill  . Acetaminophen (TYLENOL PO) Take 1 tablet by mouth  as needed.    Francia Greaves THYROID PO Take 60 mg by mouth every other day.     Marland Kitchen aspirin 81 MG tablet Take 81 mg by mouth daily.     . B Complex-Biotin-FA (B-COMPLEET-100) TABS Take 1 tablet by mouth daily.    Marland Kitchen DIOVAN 320 MG tablet Take 1 tablet by mouth daily.     . fluticasone (FLONASE) 50 MCG/ACT nasal spray Place 2 sprays into both nostrils daily.    Marland Kitchen MAGNESIUM GLYCINATE PLUS PO Take 300 mg by mouth daily.    . metoprolol succinate (TOPROL-XL) 50 MG 24 hr tablet Take 1 tablet (50 mg total) by mouth daily. 90 tablet 3  . ranitidine (ZANTAC) 150 MG tablet Take 150 mg by mouth as needed for heartburn.    Marland Kitchen Respiratory Therapy Supplies (FLUTTER) DEVI Use as directed 1 each 0  . spironolactone (ALDACTONE) 50 MG tablet Take 1 tablet (50 mg total) by mouth daily. 30 tablet 6  . valACYclovir (VALTREX)  500 MG tablet Take 500 mg by mouth daily.     No current facility-administered medications for this visit.     LABS/IMAGING: No results found for this or any previous visit (from the past 48 hour(s)). No results found.  VITALS: BP 112/74   Pulse 80   Ht 5' 2.5" (1.588 m)   Wt 184 lb 6.4 oz (83.6 kg)   BMI 33.19 kg/m   EXAM: General appearance: alert and no distress Lungs: clear to auscultation bilaterally Heart: regular rate and rhythm, S1, S2 normal and systolic murmur: early systolic 2/6, blowing at apex Extremities: extremities normal, atraumatic, no cyanosis or edema Neurologic: Grossly normal   EKG: Sinus with fusion beats at 80  ASSESSMENT: 1. DOE, related to bronchiectasis - prior TB exposure - normal nuclear stress test with EF 80% 2. Mitral murmur 3. Frequent palpitaitons 4. Fatigue/chronic myofascial pain 5. Left temporal headache  PLAN: 1.   Ms. Fulmore has had improvement in a number of her symptoms. Her dyspnea is tolerable. She denies any further chest pain. Blood pressure is fairly well controlled today. I would continue all 3 of her blood pressure medications including the aldactone. She can follow-up with me annually or sooner as necessary.  Pixie Casino, MD, [facility] Attending Cardiologist [facility]  Pixie Casino 05/07/2016, 7:59 PM

## 2016-07-23 ENCOUNTER — Ambulatory Visit: Payer: Medicare Other | Admitting: Pulmonary Disease

## 2016-08-01 ENCOUNTER — Encounter: Payer: Self-pay | Admitting: Pulmonary Disease

## 2016-08-01 ENCOUNTER — Ambulatory Visit (INDEPENDENT_AMBULATORY_CARE_PROVIDER_SITE_OTHER): Payer: Medicare Other | Admitting: Pulmonary Disease

## 2016-08-01 ENCOUNTER — Ambulatory Visit (INDEPENDENT_AMBULATORY_CARE_PROVIDER_SITE_OTHER)
Admission: RE | Admit: 2016-08-01 | Discharge: 2016-08-01 | Disposition: A | Discharge status: Home / Self Care | Payer: Medicare Other | Source: Ambulatory Visit | Attending: Pulmonary Disease | Admitting: Pulmonary Disease

## 2016-08-01 VITALS — BP 124/72 | HR 69 | Ht 62.5 in | Wt 186.0 lb

## 2016-08-01 DIAGNOSIS — J479 Bronchiectasis, uncomplicated: Secondary | ICD-10-CM | POA: Diagnosis not present

## 2016-08-01 DIAGNOSIS — R0609 Other forms of dyspnea: Secondary | ICD-10-CM | POA: Diagnosis not present

## 2016-08-01 DIAGNOSIS — R0600 Dyspnea, unspecified: Secondary | ICD-10-CM

## 2016-08-01 MED ORDER — ALBUTEROL SULFATE (2.5 MG/3ML) 0.083% IN NEBU: 2.5000 mg | INHALATION_SOLUTION | Freq: Four times a day (QID) | RESPIRATORY_TRACT | 11 refills | Status: DC | PRN

## 2016-08-01 MED ORDER — SODIUM CHLORIDE 3 % IN NEBU: INHALATION_SOLUTION | RESPIRATORY_TRACT | 11 refills | Status: DC | PRN

## 2016-08-01 NOTE — Patient Instructions (Addendum)
Use the hypertonic saline twice a day Use albuterol twice a day Exercise more often in the next month We will arrange a CXR and call you with the results Please provide [place] with a sample of your mucus We will see you back in 4-6 weeks with a nurse practitioner or sooner if needed

## 2016-08-01 NOTE — Progress Notes (Signed)
Subjective:    Patient ID: Amanda Bender, female    DOB: 1940/12/10, 76 y.o.   MRN: [id number]  Synopsis:This is a former patient of Dr. 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   HPI Chief Complaint  Patient presents with  . Follow-up    pt c/o sob with exertion, sneezing.  denies cough, mucus congestion.     [name] says she had a very painful respiratory problem from 12/25 through January 14.  She went to her PCP for evaluation of the same on January 28.  A zpack may have helped a little.  She used her flutter valve which helped when she had this.  She also took prednisone at the same time.    She says that she avoided cough suppressants when she was ill.  She had a good amount of mucus production and chest pain with cough.    She is no longer coughing up mucus right now.     Past Medical History:  Diagnosis Date  . Hypertension   . Mitral prolapse   . Thyroid disease      Family History  Problem Relation Age of Onset  . Cancer Maternal Uncle   . Cancer Maternal Grandfather   . Tuberculosis Mother   . Tuberculosis Brother   . Mesothelioma Brother      Social History   Social History  . Marital status: Single    Spouse name: N/A  . Number of children: N/A  . Years of education: N/A   Occupational History  . Not on file.   Social History Main Topics  . Smoking status: Former Smoker    Packs/day: 1.00    Years: 1.00    Types: Cigarettes    Quit date: 06/09/1961  . Smokeless tobacco: Never Used     Comment: social smoker- never a habit.    . Alcohol use No  . Drug use: No  . Sexual activity: Not on file   Other Topics Concern  . Not on file   Social History Narrative  . No narrative on file     Allergies  Allergen Reactions  . Doxycycline     Urinary incontinence  . Iohexol      Code: HIVES, Desc: pt states hives w/IVP dye several yrs  ago, pt premedicated w/50mg  benadryl 1 hr prior to scan per Dr.Stroud and pt was fine   . Sulfonamide Derivatives      Outpatient Medications Prior to Visit  Medication Sig Dispense Refill  . Acetaminophen (TYLENOL PO) Take 1 tablet by mouth as needed.    Francia Greaves THYROID PO Take 60 mg by mouth every other day.     Marland Kitchen aspirin 81 MG tablet Take 81 mg by mouth daily.     . B Complex-Biotin-FA (B-COMPLEET-100) TABS Take 1 tablet by mouth daily.    Marland Kitchen DIOVAN 320 MG tablet Take 1 tablet by mouth daily.     . fluticasone (FLONASE) 50 MCG/ACT nasal spray Place 2 sprays into both nostrils daily.    Marland Kitchen MAGNESIUM GLYCINATE PLUS PO Take 300 mg by mouth daily.    . metoprolol succinate (TOPROL-XL) 50 MG 24 hr tablet Take 1 tablet (50 mg total) by mouth daily. 90 tablet 3  . ranitidine (ZANTAC) 150 MG tablet Take 150 mg by mouth as needed for heartburn.    Marland Kitchen Respiratory Therapy Supplies (FLUTTER) DEVI Use as directed 1 each  0  . spironolactone (ALDACTONE) 50 MG tablet Take 1 tablet (50 mg total) by mouth daily. 30 tablet 6  . valACYclovir (VALTREX) 500 MG tablet Take 500 mg by mouth daily.     No facility-administered medications prior to visit.        Review of Systems  Constitutional: Negative for fever and unexpected weight change.  HENT: Positive for congestion. Negative for dental problem, ear pain, nosebleeds, postnasal drip, rhinorrhea, sinus pressure, sneezing, sore throat and trouble swallowing.   Eyes: Negative for redness and itching.  Respiratory: Positive for cough, chest tightness and shortness of breath. Negative for wheezing.   Cardiovascular: Negative for palpitations and leg swelling.  Gastrointestinal: Negative for nausea and vomiting.  Genitourinary: Negative for dysuria.  Musculoskeletal: Negative for joint swelling.  Skin: Negative for rash.  Neurological: Negative for headaches.  Hematological: Does not bruise/bleed easily.  Psychiatric/Behavioral: Negative for dysphoric  mood. The patient is not nervous/anxious.        Objective:   Physical Exam  Vitals:   08/01/16 0937  BP: 124/72  Pulse: 69  SpO2: 98%  Weight: 186 lb (84.4 kg)  Height: 5' 2.5" (1.588 m)   RA  Gen: well appearing HENT: OP clear, TM's clear, neck supple PULM: CTA B, normal percussion CV: RRR, no mgr, trace edema GI: BS+, soft, nontender Derm: no cyanosis or rash Psyche: normal mood and affect   Images: CT sinuses in 2013 without pathology. June 2017 CT scan of the chest showed right upper lobe bronchiectasis and evidence of granulomatous disease.  Other testing: Cardiopulmonary stress test forms in 2015 in [place] showed a VO2 max of 13 mL per K per minute low); minute ventilation at peak was 32 L/m which was less than 80% predicted capacity  PFT Pulmonary function testing 2015 as part of cardiopulmonary exercise test: Ratio 82%, FEV1 1.44 L (166% predicted), FVC 1.74 L (150% predicted), DLCO 13.79 (88% predicted) June 2017 pulmonary function testing ratio 85%, FEV1 1.59 L (101% predicted), FVC 2.03 L (100% predicted), total lung capacity 3.47 L (73% predicted), DLCO 14.64 (67% predicted).  CBC    Component Value Date/Time   WBC 7.9 07/09/2011 2040   RBC 5.26 (H) 07/09/2011 2040   HGB 13.3 07/09/2011 2040   HCT 40.1 07/09/2011 2040   PLT 259 07/09/2011 2040   MCV 76.2 (L) 07/09/2011 2040   MCH 25.3 (L) 07/09/2011 2040   MCHC 33.2 07/09/2011 2040   RDW 15.5 07/09/2011 2040   LYMPHSABS 1.7 07/09/2011 2040   MONOABS 0.8 07/09/2011 2040   EOSABS 0.0 07/09/2011 2040   BASOSABS 0.0 07/09/2011 2040        Assessment & Plan:  Bronchiectasis without acute exacerbation [facility]) She had one exacerbation in November and another one in January.  She still feels a little wheezing in her chest sporadically, but overall she has been stable.  I am concerned about the two episodes of bronchiectasis flares as this can represent a worsening prognosis or indicative of another  underlying infection.  Plan: Start Albuterol and hypertonic saline nebs bid Sputum culture for AFB, fungal, bacteria Chest x-ray Exercise more frequently Return to clinic in 4 weeks, if she still having worsening symptoms then repeat lung function testing and CT scan of the chest.     Current Outpatient Prescriptions:  .  Acetaminophen (TYLENOL PO), Take 1 tablet by mouth as needed., Disp: , Rfl:  .  ARMOUR THYROID PO, Take 60 mg by mouth every other day. , Disp: ,  Rfl:  .  aspirin 81 MG tablet, Take 81 mg by mouth daily. , Disp: , Rfl:  .  B Complex-Biotin-FA (B-COMPLEET-100) TABS, Take 1 tablet by mouth daily., Disp: , Rfl:  .  DIOVAN 320 MG tablet, Take 1 tablet by mouth daily. , Disp: , Rfl:  .  fenofibrate 160 MG tablet, Take 160 mg by mouth daily., Disp: , Rfl:  .  fluticasone (FLONASE) 50 MCG/ACT nasal spray, Place 2 sprays into both nostrils daily., Disp: , Rfl:  .  MAGNESIUM GLYCINATE PLUS PO, Take 300 mg by mouth daily., Disp: , Rfl:  .  metoprolol succinate (TOPROL-XL) 50 MG 24 hr tablet, Take 1 tablet (50 mg total) by mouth daily., Disp: 90 tablet, Rfl: 3 .  ranitidine (ZANTAC) 150 MG tablet, Take 150 mg by mouth as needed for heartburn., Disp: , Rfl:  .  Respiratory Therapy Supplies (FLUTTER) DEVI, Use as directed, Disp: 1 each, Rfl: 0 .  spironolactone (ALDACTONE) 50 MG tablet, Take 1 tablet (50 mg total) by mouth daily., Disp: 30 tablet, Rfl: 6 .  valACYclovir (VALTREX) 500 MG tablet, Take 500 mg by mouth daily., Disp: , Rfl:  .  albuterol (PROVENTIL) (2.5 MG/3ML) 0.083% nebulizer solution, Take 3 mLs (2.5 mg total) by nebulization every 6 (six) hours as needed for wheezing or shortness of breath., Disp: 360 mL, Rfl: 11 .  sodium chloride HYPERTONIC 3 % nebulizer solution, Take by nebulization as needed for other., Disp: 750 mL, Rfl: 11

## 2016-08-01 NOTE — Assessment & Plan Note (Signed)
She had one exacerbation in November and another one in January.  She still feels a little wheezing in her chest sporadically, but overall she has been stable.  I am concerned about the two episodes of bronchiectasis flares as this can represent a worsening prognosis or indicative of another underlying infection.  Plan: Start Albuterol and hypertonic saline nebs bid Sputum culture for AFB, fungal, bacteria Chest x-ray Exercise more frequently Return to clinic in 4 weeks, if she still having worsening symptoms then repeat lung function testing and CT scan of the chest.

## 2016-08-08 ENCOUNTER — Telehealth: Payer: Self-pay | Admitting: Pulmonary Disease

## 2016-08-08 NOTE — Telephone Encounter (Signed)
Spoke with pt. She had a question about whether or not if hypertonic saline and albuterol neb solutions can be mixed together for treatments.  BQ - please advise. Thanks.

## 2016-08-11 NOTE — Telephone Encounter (Signed)
Spoke with pt. She is aware of BQ's response. Nothing further was needed. 

## 2016-08-11 NOTE — Telephone Encounter (Signed)
No, it's better to take albuterol about 5 minutes before the saline solution

## 2016-08-22 ENCOUNTER — Telehealth: Payer: Self-pay | Admitting: Pulmonary Disease

## 2016-08-22 NOTE — Telephone Encounter (Signed)
Attempted to call pt. Line rang with a fast busy signal. Will try back.

## 2016-08-25 NOTE — Telephone Encounter (Signed)
Spoke with pt, states she has stopped her nebulized albuterol and hypertonic saline- states that she develops a tremor and has sudden fatigue after using medications.  Pt also notes that she is not coughing up anything from her chest- only has PND at this time.   Pt states her appt scheduled on 09/08/16 is to discuss her sputum findings and wants to know how to proceed since she cannot provide a sample and is unable to take the nebulized medications.    BQ please advise.  Thanks .

## 2016-08-27 NOTE — Telephone Encounter (Signed)
BQ please advise. thanks 

## 2016-08-27 NOTE — Telephone Encounter (Signed)
OK to hold off on medication until we see her next

## 2016-08-28 NOTE — Telephone Encounter (Signed)
Pt returning call this morning please give her a call.Amanda Bender

## 2016-08-28 NOTE — Telephone Encounter (Signed)
Spoke with pt, relaying below recs.  Nothing further needed.

## 2016-09-08 ENCOUNTER — Ambulatory Visit: Payer: Medicare Other | Admitting: Acute Care

## 2016-09-26 ENCOUNTER — Other Ambulatory Visit: Payer: Self-pay | Admitting: Internal Medicine

## 2016-10-06 DIAGNOSIS — E669 Obesity, unspecified: Secondary | ICD-10-CM | POA: Insufficient documentation

## 2016-11-16 ENCOUNTER — Other Ambulatory Visit: Payer: Self-pay | Admitting: Internal Medicine

## 2017-04-21 ENCOUNTER — Other Ambulatory Visit: Payer: Self-pay | Admitting: Internal Medicine

## 2017-04-21 NOTE — Telephone Encounter (Signed)
Rx(s) sent to pharmacy electronically. MD OV 05/04/17

## 2017-05-04 ENCOUNTER — Ambulatory Visit: Payer: Medicare Other | Admitting: Internal Medicine

## 2017-05-08 ENCOUNTER — Encounter: Payer: Self-pay | Admitting: Internal Medicine

## 2017-05-08 ENCOUNTER — Ambulatory Visit: Payer: Medicare Other | Admitting: Internal Medicine

## 2017-05-08 ENCOUNTER — Encounter: Payer: Self-pay | Admitting: *Deleted

## 2017-05-08 VITALS — BP 120/60 | HR 79 | Ht 62.5 in | Wt 177.0 lb

## 2017-05-08 DIAGNOSIS — M8580 Other specified disorders of bone density and structure, unspecified site: Secondary | ICD-10-CM | POA: Insufficient documentation

## 2017-05-08 DIAGNOSIS — R7309 Other abnormal glucose: Secondary | ICD-10-CM | POA: Insufficient documentation

## 2017-05-08 DIAGNOSIS — R0609 Other forms of dyspnea: Secondary | ICD-10-CM

## 2017-05-08 DIAGNOSIS — I10 Essential (primary) hypertension: Secondary | ICD-10-CM

## 2017-05-08 DIAGNOSIS — R600 Localized edema: Secondary | ICD-10-CM | POA: Diagnosis not present

## 2017-05-08 DIAGNOSIS — E559 Vitamin D deficiency, unspecified: Secondary | ICD-10-CM | POA: Insufficient documentation

## 2017-05-08 DIAGNOSIS — I059 Rheumatic mitral valve disease, unspecified: Secondary | ICD-10-CM

## 2017-05-08 DIAGNOSIS — E785 Hyperlipidemia, unspecified: Secondary | ICD-10-CM | POA: Insufficient documentation

## 2017-05-08 DIAGNOSIS — R0600 Dyspnea, unspecified: Secondary | ICD-10-CM

## 2017-05-08 MED ORDER — SPIRONOLACTONE 50 MG PO TABS: 50.0000 mg | ORAL_TABLET | Freq: Every day | ORAL | 3 refills | Status: DC

## 2017-05-08 NOTE — Patient Instructions (Signed)
Your physician wants you to follow-up in: ONE YEAR with Dr. Hilty. You will receive a reminder letter in the mail two months in advance. If you don't receive a letter, please call our office to schedule the follow-up appointment.  

## 2017-05-08 NOTE — Progress Notes (Signed)
OFFICE NOTE  Chief Complaint:  Occasional shortness of breath-stable  Primary Care Physician: Willey Blade, MD  HPI:  Amanda Bender is a pleasant 76 year old female with a number of medical problems. 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 Apparently she also has a history of murmur since she was a child and was once diagnosed with mitral valve prolapse in the past the. Her family history is significant mostly for cancer and some heart disease in her father. She was referred for evaluation of lower extremity swelling earlier in the year, for which he says is been occurring over the past several months to a year. The swelling worsens when she is on her feet and improves after elevating them at night. There is no history of varicose veins in the family and she has never been diagnosed with in the past. She does have some neuropathic type pain including pain in both heels, which is yet to have evaluated. She says is painful when she walks especially bearing weight on her heels. She is on a medications that are typically associated with lower extremity swelling. She is also not on a diuretic. Imaging was performed which was negative for any venous insufficiency, and she was diagnosed with lymphedema.   She was now referred back for evaluation of palpitations. She reports a recently she's been having an increase in fatigue, shortness of breath, especially when walking up stairs and associated palpitations. At times she feels like her heart races. She's tried coughing and bearing down to stop this but it has no effect on it. The episodes occur almost on a daily basis. She had previously seen Dr. Einar Gip and had worn a monitor for some period of time which apparently was unrevealing. She also reports chronic fatigue symptoms as well as myofascial pain which occurs in her bilateral upper arms and upper  trapezius and lower cervical spine areas as well as her legs. She reports episodes of paroxysmal pain, shortness of breath fatigue and rapid heart rate which could be signs of fibromyalgia.  Finally she reports palpitations that have improved with self treatment. She increased her metoprolol tartrate from once daily 50 mg to 50 mg in the morning and 25 mg at night. Then she started to increase it to 50 mg twice daily. She is currently taking this dose, but reports that in the early afternoon or early morning that her palpitations do "breakthrough".  Amanda Bender returns today for followup of her echocardiogram and monitor. The echocardiogram did demonstrate mild mitral regurgitation, grade 1 diastolic dysfunction and an EF of 65-70%. No wall motion abnormalities. Her monitor did demonstrate PVCs which were isolated and seem to come at various times throughout the day. There was no significant nonsustained ventricular tachycardia or other worrisome rhythms. She continues to complain of palpitations and shortness of breath with activities that previously did not bother her. Her course has been more weight gain recently as well. She continues to deny any chest pain.  Amanda Bender did undergo a metabolic test. This demonstrated a less than optimal effort with a decreased VO2 of 74%.  Peak heart rate was 68% predicted, and there was flattening of the heart rate and VO2 curves. There is a sharp rise in VO2 during free pedaling suggesting that her obesity is contributing to some of her shortness of breath. In addition, the flattening of her heart rate her suggest an element of medication  related chronotropic incompetence.  Unfortunately, she feels that she needs higher doses of beta blocker to suppress her PVCs. In fact she reports that her palpitations are improved on higher dose beta blocker and that it is actually improving her shortness of breath.  On followup today Amanda Bender has no new complaints. Her  shortness of breath is stable. Her leg swelling is still present but perhaps slightly better. She did not have a significant palpitations except when she does not get good rest.  I saw Amanda Bender back today in the office. She actually has numerous complaints. Her last exam was a couple of years ago. Today she is presenting with palpitations, worsening constant shortness of breath with minimal exertion, chest pressure, leg swelling, and has reported left temporal headache which is intermittent and not associated with vision changes. Her shortness of breath tends to be with minimal exertion and has been progressive over the past couple of months. Prior to this she had an upper respiratory infection and feels like she "has not completely gotten over it".  10/08/2015  Amanda Bender returns today for follow-up. She reports some mild improvement in shortness of breath. Her weight is down 5 pounds with the increase in Aldactone to 50 mg daily. She says her swelling is resolved. Her BNP was low at 80. Renal function is stable and potassium is normal. She continues to have some shortness of breath however and cough. In the past she had seen Dr. Lanny Hurst clamps. I advised her that he is no longer with with our pulmonary, but she would like a referral back to pulmonary for reevaluation. Blood pressure appears well controlled today. Her nuclear stress test was low risk with an EF of 80% and no evidence of ischemia. She still gets occasional temporal headaches which may be cluster headaches. Her sedimentation rate was low, not being suggestive of temporal arteritis.  05/07/2016  Amanda Bender returns for follow-up. She has seen Dr. Lake Bells with pulmonary in follow-up and he diagnosed her with bronchiectasis. 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 She was treated with antibiotics which she says helped her breathing, but now she is bothered by frequent incontinence. She denies any chest  pain.  05/08/2017  Amanda Bender was seen today in follow-up.  She reports of occasional shortness of breath which has been stable.  She has a history of bronchiectasis.  She denies any chest pain.  Blood pressure is well controlled 120/60.  She occasionally gets some leg swelling.  She said she had had some leg swelling last night but it improved in the morning.  She may have increased her salt intake recently inadvertently.  EKG shows sinus rhythm at 62 without ischemic changes.  PMHx:  Past Medical History:  Diagnosis Date  . Hypertension   . Mitral prolapse   . Thyroid disease     Past Surgical History:  Procedure Laterality Date  . ABDOMINAL HYSTERECTOMY    . BUNIONECTOMY    . LAPAROSCOPY FOR ECTOPIC PREGNANCY      FAMHx:  Family History  Problem Relation Age of Onset  . Cancer Maternal Uncle   . Cancer Maternal Grandfather   . Tuberculosis Mother   . Tuberculosis Brother   . Mesothelioma Brother     SOCHx:   reports that she quit smoking about 55 years ago. Her smoking use included cigarettes. She has a 1.00 pack-year smoking history. she has never used smokeless tobacco. She reports that she does not drink alcohol or  use drugs.  ALLERGIES:  Allergies  Allergen Reactions  . Sulfa Antibiotics Hives  . Doxycycline     Urinary incontinence  . Iohexol      Code: HIVES, Desc: pt states hives w/IVP dye several yrs ago, pt premedicated w/50mg  benadryl 1 hr prior to scan per Dr.Stroud and pt was fine   . Sulfonamide Derivatives     ROS: Pertinent items noted in HPI and remainder of comprehensive ROS otherwise negative.  HOME MEDS: Current Outpatient Medications  Medication Sig Dispense Refill  . Acetaminophen (TYLENOL PO) Take 1 tablet by mouth as needed.    Marland Kitchen albuterol (PROVENTIL) (2.5 MG/3ML) 0.083% nebulizer solution Take 3 mLs (2.5 mg total) by nebulization every 6 (six) hours as needed for wheezing or shortness of breath. 360 mL 11  . ARMOUR THYROID PO Take  60 mg by mouth every other day.     Marland Kitchen aspirin 81 MG tablet Take 81 mg by mouth daily.     . B Complex-Biotin-FA (B-COMPLEET-100) TABS Take 1 tablet by mouth daily.    . fenofibrate 160 MG tablet Take 160 mg by mouth daily.    . fluticasone (FLONASE) 50 MCG/ACT nasal spray Place 2 sprays into both nostrils daily.    Marland Kitchen MAGNESIUM GLYCINATE PLUS PO Take 300 mg by mouth daily.    . metoprolol succinate (TOPROL-XL) 50 MG 24 hr tablet take 1 tablet by mouth once daily 90 tablet 3  . olmesartan (BENICAR) 40 MG tablet Take 40 mg by mouth daily.    . ranitidine (ZANTAC) 150 MG tablet Take 150 mg by mouth as needed for heartburn.    Marland Kitchen Respiratory Therapy Supplies (FLUTTER) DEVI Use as directed 1 each 0  . sodium chloride HYPERTONIC 3 % nebulizer solution Take by nebulization as needed for other. 750 mL 11  . spironolactone (ALDACTONE) 50 MG tablet Take 1 tablet (50 mg total) by mouth daily. 90 tablet 3  . valACYclovir (VALTREX) 500 MG tablet Take 500 mg by mouth daily.     No current facility-administered medications for this visit.     LABS/IMAGING: No results found for this or any previous visit (from the past 48 hour(s)). No results found.  VITALS: BP 120/60   Pulse 79   Ht 5' 2.5" (1.588 m)   Wt 177 lb (80.3 kg)   SpO2 98%   BMI 31.86 kg/m   EXAM: General appearance: alert and no distress Neck: no carotid bruit, no JVD and thyroid not enlarged, symmetric, no tenderness/mass/nodules Lungs: clear to auscultation bilaterally Heart: regular rate and rhythm, S1, S2 normal and systolic murmur: early systolic 2/6, blowing at apex Abdomen: soft, non-tender; bowel sounds normal; no masses,  no organomegaly Extremities: extremities normal, atraumatic, no cyanosis or edema Pulses: 2+ and symmetric Skin: Skin color, texture, turgor normal. No rashes or lesions Neurologic: Grossly normal Psych: Pleasant   EKG: Normal sinus rhythm at 62-personally reviewed  ASSESSMENT: 1. 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 Mitral murmur 3. Frequent palpitaitons 4. Fatigue/chronic myofascial pain 5. Left temporal headache  PLAN: 1.   Amanda Bender that she has had no significant new palpitations.  She denies any chest pain.  She has a stable mitral murmur.  Her EF was normal on recent stress testing.  She has stable shortness of breath.  Blood pressures well controlled. Continue current medications. Follow-up annually or sooner as necessary.  Pixie Casino, MD, [facility], FACP  [facility]  Index Director of the [facility]  [facility] Attending Cardiologist  Direct Dial: [phone]  Fax: [phone]  Website:  [email or web address] 05/08/2017, 1:48 PM

## 2017-10-26 ENCOUNTER — Other Ambulatory Visit: Payer: Self-pay | Admitting: Internal Medicine

## 2017-11-27 ENCOUNTER — Telehealth: Payer: Self-pay | Admitting: Internal Medicine

## 2017-11-27 NOTE — Telephone Encounter (Signed)
   [facility] Pre-operative Risk Assessment    Request for surgical clearance:  1. What type of surgery is being performed? Right shoulder arthroscopy rotator cuff repair subacromial decompression   2. When is this surgery scheduled? December 15, 2017   3. What type of clearance is required (medical clearance vs. Pharmacy clearance to hold med vs. Both)? Both   4. Are there any medications that need to be held prior to surgery and how long? None specified - patient is on ASA   5. Practice name and name of physician performing surgery? Dr. Tania Ade @ [place] [facility] [place]    6. What is your office phone number [phone]   7.   What is your office fax number [phone] *attn: Tammi Sou surgery coordinator*  8.   Anesthesia type (None, local, MAC, general) ? Choice    Amanda Bender M 11/27/2017, 3:50 PM  _________________________________________________________________   (provider comments below)

## 2017-12-01 NOTE — Telephone Encounter (Signed)
   Primary Cardiologist: Pixie Casino, MD  Chart reviewed as part of pre-operative protocol coverage. Patient was contacted 12/01/2017 in reference to pre-operative risk assessment for pending surgery as outlined below.  Amanda Bender was last seen on 04/2017 by Dr. Debara Pickett. H/o DOE related to bronchiectasis, prior normal nuc 09/2015, palpitations, PVCs, heart murmur with trivial MR 2017, Graves disease, LEE. Attempted to contact patient at home but got voicemail. LMOM to call back.  We were not asked if we could hold aspirin but given no history of MI, CAD, stenting, from cardiac standpoint, it would be acceptable to hold aspirin 5-7 days if deemed necessary by surgeon.  Charlie Pitter, PA-C 12/01/2017, 2:58 PM

## 2017-12-04 NOTE — Telephone Encounter (Addendum)
   Primary Cardiologist: Pixie Casino, MD  Chart reviewed as part of pre-operative protocol coverage and I spoke with the patient on the phone.   She still has palpitations, worse at sometimes than others, but she believes they are worse with stress.  She is taking her metoprolol daily.  Her blood pressure and heart rate are well controlled per her reports, no dose increase is recommended at this time.  Her dyspnea on exertion limits her ability to ambulate or climb stairs, but it is at baseline.  Given past medical history, based on ACC/AHA guidelines, Yaneisy A Musselman would be at acceptable risk for the planned procedure without further cardiovascular testing.    However, Ms. Dobesh states her family physician has been consulted regarding the respiratory risks of the surgery, agree with this.  From a safety standpoint, it might be prudent to perform the surgery in the hospital (as opposed to a [place]).  I will route this recommendation to the requesting party via Epic fax function and remove from pre-op pool.  Please call with questions.  Rosaria Ferries, PA-C 12/04/2017, 8:11 AM  Dr. Tania Ade @ [place]   [phone] office fax number [phone] *attn: Tammi Sou surgery coordinator*

## 2017-12-11 ENCOUNTER — Other Ambulatory Visit: Payer: Self-pay | Admitting: Orthopedic Surgery

## 2017-12-29 NOTE — Pre-Procedure Instructions (Signed)
Amanda Bender  12/29/2017    Your procedure is scheduled on Thursday, January 07, 2018 at 12:35 PM.   Report to .[facility] Entrance "A" Admitting Office at 10:30 AM.   Call this number if you have problems the morning of surgery: [phone]   Questions prior to day of surgery, please call [phone] between 8 & 4 PM.   Remember:  Do not eat or drink after midnight Wednesday, 01/06/18.  Take these medicines the morning of surgery with A SIP OF WATER: Metoprolol (Toprol SL), Omeprazole (Prilosec), Thyroid (NP Thyroid), Valacyclovir (Valtrex), may use eye drops, Albuterol nebulizer - if needed, Albuterol (Proventil) inhaler - if needed (bring inhaler with you day of surgery.  Do not use NSAIDS (Ibuprofen, Aleve, etc) or Aspirin products 7 days prior to surgery.    Do not wear jewelry, make-up or nail polish.  Do not wear lotions, powders, perfumes or deodorant.  Do not shave 48 hours prior to surgery.    Do not bring valuables to the hospital.  [facility] is not responsible for any belongings or valuables.  Contacts, dentures or bridgework may not be worn into surgery.  Leave your suitcase in the car.  After surgery it may be brought to your room.  For patients admitted to the hospital, discharge time will be determined by your treatment team.  Patients discharged the day of surgery will not be allowed to drive home.   [facility] - Preparing for Surgery  Before surgery, you can play an important role.  Because skin is not sterile, your skin needs to be as free of germs as possible.  You can reduce the number of germs on you skin by washing with CHG (chlorahexidine gluconate) soap before surgery.  CHG is an antiseptic cleaner which kills germs and bonds with the skin to continue killing germs even after washing.  Oral Hygiene is also important in reducing the risk of infection.  Remember to brush your teeth with your regular toothpaste the morning of surgery.  Please  DO NOT use if you have an allergy to CHG or antibacterial soaps.  If your skin becomes reddened/irritated stop using the CHG and inform your nurse when you arrive at Short Stay.  Do not shave (including legs and underarms) for at least 48 hours prior to the first CHG shower.  You may shave your face.  Please follow these instructions carefully:   1.  Shower with CHG Soap the night before surgery and the morning of Surgery.  2.  If you choose to wash your hair, wash your hair first as usual with your normal shampoo.  3.  After you shampoo, rinse your hair and body thoroughly to remove the shampoo. 4.  Use CHG as you would any other liquid soap.  You can apply chg directly to the skin and wash gently with a      scrungie or washcloth.           5.  Apply the CHG Soap to your body ONLY FROM THE NECK DOWN.   Do not use on open wounds or open sores. Avoid contact with your eyes, ears, mouth and genitals (private parts).  Wash genitals (private parts) with your normal soap.  6.  Wash thoroughly, paying special attention to the area where your surgery will be performed.  7.  Thoroughly rinse your body with warm water from the neck down.  8.  DO NOT shower/wash with your normal soap after using and  rinsing off the CHG Soap.  9.  Pat yourself dry with a clean towel.            10.  Wear clean pajamas.            11.  Place clean sheets on your bed the night of your first shower and do not sleep with pets.  Day of Surgery  Shower as above. Do not apply any lotions/deodorants the morning of surgery.   Please wear clean clothes to the hospital. Remember to brush your teeth with toothpaste.   Please read over the fact sheets that you were given.

## 2017-12-30 ENCOUNTER — Encounter (HOSPITAL_COMMUNITY)
Admission: RE | Admit: 2017-12-30 | Discharge: 2017-12-30 | Disposition: A | Discharge status: Home / Self Care | Payer: Medicare Other | Source: Ambulatory Visit | Attending: Orthopedic Surgery | Admitting: Orthopedic Surgery

## 2017-12-30 ENCOUNTER — Encounter (HOSPITAL_COMMUNITY): Payer: Self-pay

## 2017-12-30 ENCOUNTER — Other Ambulatory Visit: Payer: Self-pay

## 2017-12-30 DIAGNOSIS — Z01812 Encounter for preprocedural laboratory examination: Secondary | ICD-10-CM | POA: Insufficient documentation

## 2017-12-30 HISTORY — DX: Dyspnea, unspecified: R06.00

## 2017-12-30 HISTORY — DX: Anemia, unspecified: D64.9

## 2017-12-30 HISTORY — DX: Headache, unspecified: R51.9

## 2017-12-30 HISTORY — DX: Thyrotoxicosis, unspecified without thyrotoxic crisis or storm: E05.90

## 2017-12-30 HISTORY — DX: Hypothyroidism, unspecified: E03.9

## 2017-12-30 HISTORY — DX: Cramp and spasm: R25.2

## 2017-12-30 HISTORY — DX: Headache: R51

## 2017-12-30 HISTORY — DX: Gastro-esophageal reflux disease without esophagitis: K21.9

## 2017-12-30 HISTORY — DX: Cardiac murmur, unspecified: R01.1

## 2017-12-30 HISTORY — DX: Diverticulosis of intestine, part unspecified, without perforation or abscess without bleeding: K57.90

## 2017-12-30 HISTORY — DX: Ulcer of esophagus without bleeding: K22.10

## 2017-12-30 HISTORY — DX: Adverse effect of unspecified anesthetic, initial encounter: T41.45XA

## 2017-12-30 HISTORY — DX: Bronchiectasis, uncomplicated: J47.9

## 2017-12-30 HISTORY — DX: Unspecified osteoarthritis, unspecified site: M19.90

## 2017-12-30 HISTORY — DX: Ventricular premature depolarization: I49.3

## 2017-12-30 HISTORY — DX: Cyst of kidney, acquired: N28.1

## 2017-12-30 HISTORY — DX: Personal history of other diseases of the digestive system: Z87.19

## 2017-12-30 HISTORY — DX: Other complications of anesthesia, initial encounter: T88.59XA

## 2017-12-30 HISTORY — DX: Anxiety disorder, unspecified: F41.9

## 2017-12-30 HISTORY — DX: Pneumonia, unspecified organism: J18.9

## 2017-12-30 LAB — BASIC METABOLIC PANEL
ANION GAP: 7 (ref 5–15)
BUN: 28 mg/dL — ABNORMAL HIGH (ref 8–23)
CO2: 26 mmol/L (ref 22–32)
Calcium: 9.1 mg/dL (ref 8.9–10.3)
Chloride: 111 mmol/L (ref 98–111)
Creatinine, Ser: 1.56 mg/dL — ABNORMAL HIGH (ref 0.44–1.00)
GFR calc Af Amer: 36 mL/min — ABNORMAL LOW (ref >60)
GFR, EST NON AFRICAN AMERICAN: 31 mL/min — AB (ref >60)
GLUCOSE: 88 mg/dL (ref 70–99)
POTASSIUM: 4 mmol/L (ref 3.5–5.1)
SODIUM: 144 mmol/L (ref 135–145)

## 2017-12-30 LAB — CBC
HEMATOCRIT: 37.2 % (ref 36.0–46.0)
HEMOGLOBIN: 11.3 g/dL — AB (ref 12.0–15.0)
MCH: 26.1 pg (ref 26.0–34.0)
MCHC: 30.4 g/dL (ref 30.0–36.0)
MCV: 85.9 fL (ref 78.0–100.0)
Platelets: 281 10*3/uL (ref 150–400)
RBC: 4.33 MIL/uL (ref 3.87–5.11)
RDW: 14.5 % (ref 11.5–15.5)
WBC: 6.8 10*3/uL (ref 4.0–10.5)

## 2018-01-07 ENCOUNTER — Ambulatory Visit (HOSPITAL_COMMUNITY): Payer: Medicare Other | Admitting: Certified Registered Nurse Anesthetist

## 2018-01-07 ENCOUNTER — Encounter (HOSPITAL_COMMUNITY): Payer: Self-pay

## 2018-01-07 ENCOUNTER — Encounter (HOSPITAL_COMMUNITY)
Admission: RE | Disposition: A | Discharge status: Home / Self Care | Payer: Self-pay | Source: Ambulatory Visit | Attending: Orthopedic Surgery

## 2018-01-07 ENCOUNTER — Ambulatory Visit (HOSPITAL_COMMUNITY)
Admission: RE | Admit: 2018-01-07 | Discharge: 2018-01-07 | Disposition: A | Discharge status: Home / Self Care | Payer: Medicare Other | Source: Ambulatory Visit | Attending: Orthopedic Surgery | Admitting: Orthopedic Surgery

## 2018-01-07 DIAGNOSIS — D649 Anemia, unspecified: Secondary | ICD-10-CM | POA: Insufficient documentation

## 2018-01-07 DIAGNOSIS — K219 Gastro-esophageal reflux disease without esophagitis: Secondary | ICD-10-CM | POA: Insufficient documentation

## 2018-01-07 DIAGNOSIS — Z79899 Other long term (current) drug therapy: Secondary | ICD-10-CM | POA: Insufficient documentation

## 2018-01-07 DIAGNOSIS — Z87891 Personal history of nicotine dependence: Secondary | ICD-10-CM | POA: Diagnosis not present

## 2018-01-07 DIAGNOSIS — Z79890 Hormone replacement therapy (postmenopausal): Secondary | ICD-10-CM | POA: Insufficient documentation

## 2018-01-07 DIAGNOSIS — M75121 Complete rotator cuff tear or rupture of right shoulder, not specified as traumatic: Secondary | ICD-10-CM | POA: Diagnosis not present

## 2018-01-07 DIAGNOSIS — I10 Essential (primary) hypertension: Secondary | ICD-10-CM | POA: Insufficient documentation

## 2018-01-07 DIAGNOSIS — E890 Postprocedural hypothyroidism: Secondary | ICD-10-CM | POA: Insufficient documentation

## 2018-01-07 HISTORY — PX: SHOULDER ARTHROSCOPY WITH ROTATOR CUFF REPAIR AND SUBACROMIAL DECOMPRESSION: SHX5686

## 2018-01-07 SURGERY — SHOULDER ARTHROSCOPY WITH ROTATOR CUFF REPAIR AND SUBACROMIAL DECOMPRESSION
Anesthesia: Regional | Site: Shoulder | Laterality: Right

## 2018-01-07 MED ORDER — FENTANYL CITRATE (PF) 100 MCG/2ML IJ SOLN
INTRAMUSCULAR | Status: DC | PRN
  Administered 2018-01-07: 100 ug via INTRAVENOUS

## 2018-01-07 MED ORDER — MIDAZOLAM HCL 2 MG/2ML IJ SOLN
INTRAMUSCULAR | Status: AC
  Administered 2018-01-07: 2 mg via INTRAVENOUS
  Filled 2018-01-07: qty 2

## 2018-01-07 MED ORDER — EPHEDRINE 5 MG/ML INJ
INTRAVENOUS | Status: AC
  Filled 2018-01-07: qty 10

## 2018-01-07 MED ORDER — SUGAMMADEX SODIUM 200 MG/2ML IV SOLN
INTRAVENOUS | Status: AC
  Filled 2018-01-07: qty 2

## 2018-01-07 MED ORDER — MIDAZOLAM HCL 2 MG/2ML IJ SOLN
2.0000 mg | Freq: Once | INTRAMUSCULAR | Status: AC
Start: 2018-01-07 — End: 2018-01-07
  Administered 2018-01-07: 2 mg via INTRAVENOUS

## 2018-01-07 MED ORDER — PROPOFOL 10 MG/ML IV BOLUS
INTRAVENOUS | Status: AC
  Filled 2018-01-07: qty 20

## 2018-01-07 MED ORDER — POVIDONE-IODINE 7.5 % EX SOLN: Freq: Once | CUTANEOUS | Status: DC

## 2018-01-07 MED ORDER — LACTATED RINGERS IV SOLN
INTRAVENOUS | Status: DC
  Administered 2018-01-07: 11:00:00 via INTRAVENOUS

## 2018-01-07 MED ORDER — PHENYLEPHRINE 40 MCG/ML (10ML) SYRINGE FOR IV PUSH (FOR BLOOD PRESSURE SUPPORT)
PREFILLED_SYRINGE | INTRAVENOUS | Status: AC
  Filled 2018-01-07: qty 10

## 2018-01-07 MED ORDER — ONDANSETRON HCL 4 MG/2ML IJ SOLN: 4.0000 mg | Freq: Once | INTRAMUSCULAR | Status: DC | PRN

## 2018-01-07 MED ORDER — GLYCOPYRROLATE 0.2 MG/ML IJ SOLN
INTRAMUSCULAR | Status: DC | PRN
  Administered 2018-01-07: 0.2 mg via INTRAVENOUS

## 2018-01-07 MED ORDER — LIDOCAINE HCL (CARDIAC) PF 100 MG/5ML IV SOSY
PREFILLED_SYRINGE | INTRAVENOUS | Status: DC | PRN
  Administered 2018-01-07: 100 mg via INTRAVENOUS

## 2018-01-07 MED ORDER — DEXAMETHASONE SODIUM PHOSPHATE 10 MG/ML IJ SOLN
INTRAMUSCULAR | Status: AC
  Filled 2018-01-07: qty 1

## 2018-01-07 MED ORDER — FENTANYL CITRATE (PF) 100 MCG/2ML IJ SOLN
50.0000 ug | Freq: Once | INTRAMUSCULAR | Status: AC
  Administered 2018-01-07: 50 ug via INTRAVENOUS

## 2018-01-07 MED ORDER — HYDROMORPHONE HCL 1 MG/ML IJ SOLN: 0.2500 mg | INTRAMUSCULAR | Status: DC | PRN

## 2018-01-07 MED ORDER — ROCURONIUM BROMIDE 100 MG/10ML IV SOLN
INTRAVENOUS | Status: DC | PRN
  Administered 2018-01-07: 50 mg via INTRAVENOUS

## 2018-01-07 MED ORDER — MEPERIDINE HCL 50 MG/ML IJ SOLN
6.2500 mg | INTRAMUSCULAR | Status: DC | PRN
Start: 2018-01-07 — End: 2018-01-07

## 2018-01-07 MED ORDER — ONDANSETRON HCL 4 MG/2ML IJ SOLN
INTRAMUSCULAR | Status: DC | PRN
  Administered 2018-01-07: 4 mg via INTRAVENOUS

## 2018-01-07 MED ORDER — HYDROCODONE-ACETAMINOPHEN 5-325 MG PO TABS: 1.0000 | ORAL_TABLET | ORAL | 0 refills | Status: DC | PRN

## 2018-01-07 MED ORDER — ONDANSETRON HCL 4 MG/2ML IJ SOLN
4.0000 mg | Freq: Once | INTRAMUSCULAR | Status: AC
  Administered 2018-01-07: 4 mg via INTRAVENOUS

## 2018-01-07 MED ORDER — EPHEDRINE SULFATE 50 MG/ML IJ SOLN
INTRAMUSCULAR | Status: DC | PRN
  Administered 2018-01-07: 10 mg via INTRAVENOUS

## 2018-01-07 MED ORDER — ROCURONIUM BROMIDE 10 MG/ML (PF) SYRINGE
PREFILLED_SYRINGE | INTRAVENOUS | Status: AC
  Filled 2018-01-07: qty 10

## 2018-01-07 MED ORDER — FENTANYL CITRATE (PF) 250 MCG/5ML IJ SOLN
INTRAMUSCULAR | Status: AC
  Filled 2018-01-07: qty 5

## 2018-01-07 MED ORDER — SUCCINYLCHOLINE CHLORIDE 200 MG/10ML IV SOSY
PREFILLED_SYRINGE | INTRAVENOUS | Status: AC
  Filled 2018-01-07: qty 10

## 2018-01-07 MED ORDER — ONDANSETRON HCL 4 MG/2ML IJ SOLN
INTRAMUSCULAR | Status: AC
  Filled 2018-01-07: qty 2

## 2018-01-07 MED ORDER — PHENYLEPHRINE HCL 10 MG/ML IJ SOLN
INTRAMUSCULAR | Status: DC | PRN
  Administered 2018-01-07: 100 ug/min via INTRAVENOUS

## 2018-01-07 MED ORDER — PROPOFOL 10 MG/ML IV BOLUS
INTRAVENOUS | Status: DC | PRN
  Administered 2018-01-07: 100 mg via INTRAVENOUS

## 2018-01-07 MED ORDER — CEFAZOLIN SODIUM-DEXTROSE 2-4 GM/100ML-% IV SOLN
2.0000 g | INTRAVENOUS | Status: AC
  Administered 2018-01-07: 2 g via INTRAVENOUS
  Filled 2018-01-07: qty 100

## 2018-01-07 MED ORDER — SUGAMMADEX SODIUM 200 MG/2ML IV SOLN
INTRAVENOUS | Status: DC | PRN
  Administered 2018-01-07: 200 mg via INTRAVENOUS

## 2018-01-07 MED ORDER — FENTANYL CITRATE (PF) 100 MCG/2ML IJ SOLN
INTRAMUSCULAR | Status: AC
  Administered 2018-01-07: 50 ug via INTRAVENOUS
  Filled 2018-01-07: qty 2

## 2018-01-07 MED ORDER — DEXAMETHASONE SODIUM PHOSPHATE 10 MG/ML IJ SOLN
INTRAMUSCULAR | Status: DC | PRN
  Administered 2018-01-07: 10 mg via INTRAVENOUS

## 2018-01-07 SURGICAL SUPPLY — 62 items
ANCH SUT SWLK 19.1X4.75 VT (Anchor) ×4 IMPLANT
ANCHOR PEEK 4.75X19.1 SWLK C (Anchor) ×4 IMPLANT
BLADE SURG 11 STRL SS (BLADE) ×2 IMPLANT
BUR OVAL 4.0 (BURR) ×2 IMPLANT
CANNULA 5.75X71 LONG (CANNULA) ×2 IMPLANT
CANNULA TWIST IN 8.25X7CM (CANNULA) IMPLANT
CHLORAPREP W/TINT 26ML (MISCELLANEOUS) ×2 IMPLANT
CUTTER BONE 4.0MM X 13CM (MISCELLANEOUS) ×1 IMPLANT
DRAPE INCISE IOBAN 66X45 STRL (DRAPES) ×2 IMPLANT
DRAPE ORTHO SPLIT 77X108 STRL (DRAPES) ×4
DRAPE STERI 35X30 U-POUCH (DRAPES) ×2 IMPLANT
DRAPE SURG 17X23 STRL (DRAPES) ×2 IMPLANT
DRAPE SURG ORHT 6 SPLT 77X108 (DRAPES) ×2 IMPLANT
DRAPE U-SHAPE 47X51 STRL (DRAPES) ×2 IMPLANT
DRAPE U-SHAPE 76X120 STRL (DRAPES) ×2 IMPLANT
DRSG PAD ABDOMINAL 8X10 ST (GAUZE/BANDAGES/DRESSINGS) ×4 IMPLANT
GAUZE SPONGE 4X4 12PLY STRL (GAUZE/BANDAGES/DRESSINGS) ×2 IMPLANT
GAUZE XEROFORM 1X8 LF (GAUZE/BANDAGES/DRESSINGS) ×2 IMPLANT
GLOVE BIO SURGEON STRL SZ7 (GLOVE) ×4 IMPLANT
GLOVE BIO SURGEON STRL SZ7.5 (GLOVE) ×2 IMPLANT
GLOVE BIOGEL PI IND STRL 7.0 (GLOVE) ×2 IMPLANT
GLOVE BIOGEL PI IND STRL 8 (GLOVE) ×1 IMPLANT
GLOVE BIOGEL PI INDICATOR 7.0 (GLOVE) ×2
GLOVE BIOGEL PI INDICATOR 8 (GLOVE) ×1
GOWN STRL REUS W/ TWL LRG LVL3 (GOWN DISPOSABLE) ×3 IMPLANT
GOWN STRL REUS W/ TWL XL LVL3 (GOWN DISPOSABLE) ×1 IMPLANT
GOWN STRL REUS W/TWL LRG LVL3 (GOWN DISPOSABLE) ×6
GOWN STRL REUS W/TWL XL LVL3 (GOWN DISPOSABLE) ×2
KIT BASIN OR (CUSTOM PROCEDURE TRAY) ×2 IMPLANT
KIT TURNOVER KIT B (KITS) ×2 IMPLANT
MANIFOLD NEPTUNE II (INSTRUMENTS) ×2 IMPLANT
NDL HYPO 25GX1X1/2 BEV (NEEDLE) IMPLANT
NDL SCORPION MULTI FIRE (NEEDLE) IMPLANT
NDL SPNL 18GX3.5 QUINCKE PK (NEEDLE) ×1 IMPLANT
NDL SUT 6 .5 CRC .975X.05 MAYO (NEEDLE) IMPLANT
NEEDLE HYPO 25GX1X1/2 BEV (NEEDLE) IMPLANT
NEEDLE MAYO TAPER (NEEDLE)
NEEDLE SCORPION MULTI FIRE (NEEDLE) ×2 IMPLANT
NEEDLE SPNL 18GX3.5 QUINCKE PK (NEEDLE) ×2 IMPLANT
PACK SHOULDER (CUSTOM PROCEDURE TRAY) ×2 IMPLANT
PAD ARMBOARD 7.5X6 YLW CONV (MISCELLANEOUS) ×4 IMPLANT
PROBE BIPOLAR ATHRO 135MM 90D (MISCELLANEOUS) ×1 IMPLANT
RESECTOR FULL RADIUS 4.2MM (BLADE) ×2 IMPLANT
SLING ARM FOAM STRAP LRG (SOFTGOODS) ×2 IMPLANT
SLING ARM FOAM STRAP MED (SOFTGOODS) IMPLANT
SPONGE LAP 4X18 RFD (DISPOSABLE) IMPLANT
SUPPORT WRAP ARM LG (MISCELLANEOUS) ×2 IMPLANT
SUT 2 FIBERLOOP 20 STRT BLUE (SUTURE)
SUT ETHILON 3 0 PS 1 (SUTURE) ×2 IMPLANT
SUT TIGER TAPE 7 IN WHITE (SUTURE) ×1 IMPLANT
SUTURE 2 FIBERLOOP 20 STRT BLU (SUTURE) IMPLANT
SUTURE TAPE 1.3 40 TPR END (SUTURE) IMPLANT
SUTURETAPE 1.3 40 TPR END (SUTURE)
SYR CONTROL 10ML LL (SYRINGE) ×2 IMPLANT
TAPE FIBER 2MM 7IN #2 BLUE (SUTURE) ×1 IMPLANT
TOWEL OR 17X24 6PK STRL BLUE (TOWEL DISPOSABLE) ×2 IMPLANT
TOWEL OR 17X26 10 PK STRL BLUE (TOWEL DISPOSABLE) ×2 IMPLANT
TOWEL OR NON WOVEN STRL DISP B (DISPOSABLE) ×2 IMPLANT
TUBE CONNECTING 12X1/4 (SUCTIONS) ×2 IMPLANT
TUBING ARTHROSCOPY IRRIG 16FT (MISCELLANEOUS) ×2 IMPLANT
WAND HAND CNTRL MULTIVAC 90 (MISCELLANEOUS) ×1 IMPLANT
WATER STERILE IRR 1000ML POUR (IV SOLUTION) ×2 IMPLANT

## 2018-01-07 NOTE — Anesthesia Preprocedure Evaluation (Signed)
Anesthesia Evaluation  Patient identified by MRN, date of birth, ID band Patient awake    Reviewed: Allergy & Precautions, NPO status , Patient's Chart, lab work & pertinent test results  Airway Mallampati: I  TM Distance: >3 FB Neck ROM: Full    Dental   Pulmonary former smoker,    Pulmonary exam normal        Cardiovascular hypertension, Pt. on medications Normal cardiovascular exam     Neuro/Psych Anxiety    GI/Hepatic GERD  Medicated and Controlled,  Endo/Other    Renal/GU      Musculoskeletal   Abdominal   Peds  Hematology   Anesthesia Other Findings   Reproductive/Obstetrics                             Anesthesia Physical Anesthesia Plan  ASA: II  Anesthesia Plan: General   Post-op Pain Management:  Regional for Post-op pain   Induction: Intravenous  PONV Risk Score and Plan: 3 and Ondansetron, Dexamethasone and Midazolam  Airway Management Planned: Oral ETT  Additional Equipment:   Intra-op Plan:   Post-operative Plan: Extubation in OR  Informed Consent: I have reviewed the patients History and Physical, chart, labs and discussed the procedure including the risks, benefits and alternatives for the proposed anesthesia with the patient or authorized representative who has indicated his/her understanding and acceptance.     Plan Discussed with: CRNA and Surgeon  Anesthesia Plan Comments:         Anesthesia Quick Evaluation
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
## 2018-01-07 NOTE — Transfer of Care (Signed)
Immediate Anesthesia Transfer of Care Note  Patient: Amanda Bender  Procedure(s) Performed: SHOULDER ARTHROSCOPY WITH ROTATOR CUFF REPAIR AND SUBACROMIAL DECOMPRESSION (Right Shoulder)  Patient Location: PACU  Anesthesia Type:General and Regional  Level of Consciousness: awake, alert , oriented and patient cooperative  Airway & Oxygen Therapy: Patient Spontanous Breathing and Patient connected to nasal cannula oxygen  Post-op Assessment: Report given to RN and Post -op Vital signs reviewed and stable  Post vital signs: Reviewed and stable  Last Vitals:  Vitals Value Taken Time  BP 125/75 01/07/2018  2:08 PM  Temp    Pulse 82 01/07/2018  2:09 PM  Resp 17 01/07/2018  2:09 PM  SpO2 100 % 01/07/2018  2:09 PM  Vitals shown include unvalidated device data.  Last Pain:  Vitals:   01/07/18 1103  TempSrc:   PainSc: 3          Complications: No apparent anesthesia complications

## 2018-01-07 NOTE — Anesthesia Postprocedure Evaluation (Signed)
Anesthesia Post Note  Patient: Amanda Bender  Procedure(s) Performed: SHOULDER ARTHROSCOPY WITH ROTATOR CUFF REPAIR AND SUBACROMIAL DECOMPRESSION (Right Shoulder)     Patient location during evaluation: PACU Anesthesia Type: Regional Level of consciousness: awake and alert Pain management: pain level controlled Vital Signs Assessment: post-procedure vital signs reviewed and stable Respiratory status: spontaneous breathing, nonlabored ventilation, respiratory function stable and patient connected to nasal cannula oxygen Cardiovascular status: blood pressure returned to baseline and stable Postop Assessment: no apparent nausea or vomiting Anesthetic complications: no    Last Vitals:  Vitals:   01/07/18 1600 01/07/18 1623  BP:  (!) 143/84  Pulse: (!) 50   Resp: 16   Temp: (!) 36.1 C   SpO2: 100% 100%    Last Pain:  Vitals:   01/07/18 1623  TempSrc:   PainSc: 0-No pain                 Cybele Maule DAVID

## 2018-01-07 NOTE — Anesthesia Procedure Notes (Signed)
Procedure Name: Intubation Date/Time: 01/07/2018 12:53 PM Performed by: Shirlyn Goltz, CRNA Pre-anesthesia Checklist: Patient identified, Emergency Drugs available, Suction available and Patient being monitored Patient Re-evaluated:Patient Re-evaluated prior to induction Oxygen Delivery Method: Circle system utilized Preoxygenation: Pre-oxygenation with 100% oxygen Induction Type: IV induction Ventilation: Mask ventilation without difficulty Laryngoscope Size: Mac and 3 Grade View: Grade I Tube type: Oral Tube size: 6.5 mm Number of attempts: 1 Airway Equipment and Method: Stylet Placement Confirmation: ETT inserted through vocal cords under direct vision,  positive ETCO2 and breath sounds checked- equal and bilateral Secured at: 21 cm Tube secured with: Tape Dental Injury: Teeth and Oropharynx as per pre-operative assessment

## 2018-01-07 NOTE — Discharge Instructions (Signed)
Discharge Instructions after Arthroscopic Shoulder Repair ° ° °A sling has been provided for you. Remain in your sling at all times. This includes sleeping in your sling.  °Use ice on the shoulder intermittently over the first 48 hours after surgery.  °Pain medicine has been prescribed for you.  °Use your medicine liberally over the first 48 hours, and then you can begin to taper your use. You may take Extra Strength Tylenol or Tylenol only in place of the pain pills. DO NOT take ANY nonsteroidal anti-inflammatory pain medications: Advil, Motrin, Ibuprofen, Aleve, Naproxen, or Narprosyn.  °You may remove your dressing after two days. If the incision sites are still moist, place a Band-Aid over the moist site(s). Change Band-Aids daily until dry.  °You may shower 5 days after surgery. The incisions CANNOT get wet prior to 5 days. Simply allow the water to wash over the site and then pat dry. Do not rub the incisions. Make sure your axilla (armpit) is completely dry after showering.  °Take one aspirin a day for 2 weeks after surgery, unless you have an aspirin sensitivity/ allergy or asthma. ° ° °Please call [phone] during normal business hours or [phone] after hours for any problems. Including the following: ° °- excessive redness of the incisions °- drainage for more than 4 days °- fever of more than 101.5 F ° °*Please note that pain medications will not be refilled after hours or on weekends. ° ° ° °Post Anesthesia Home Care Instructions ° °Activity: °Get plenty of rest for the remainder of the day. A responsible individual must stay with you for 24 hours following the procedure.  °For the next 24 hours, DO NOT: °-Drive a car °-Operate machinery °-Drink alcoholic beverages °-Take any medication unless instructed by your physician °-Make any legal decisions or sign important papers. ° °Meals: °Start with liquid foods such as gelatin or soup. Progress to regular foods as tolerated. Avoid greasy, spicy,  heavy foods. If nausea and/or vomiting occur, drink only clear liquids until the nausea and/or vomiting subsides. Call your physician if vomiting continues. ° °Special Instructions/Symptoms: °Your throat may feel dry or sore from the anesthesia or the breathing tube placed in your throat during surgery. If this causes discomfort, gargle with warm salt water. The discomfort should disappear within 24 hours. ° °If you had a scopolamine patch placed behind your ear for the management of post- operative nausea and/or vomiting: ° °1. The medication in the patch is effective for 72 hours, after which it should be removed.  Wrap patch in a tissue and discard in the trash. Wash hands thoroughly with soap and water. °2. You may remove the patch earlier than 72 hours if you experience unpleasant side effects which may include dry mouth, dizziness or visual disturbances. °3. Avoid touching the patch. Wash your hands with soap and water after contact with the patch. ° °Regional Anesthesia Blocks ° °1. Numbness or the inability to move the "blocked" extremity may last from 3-48 hours after placement. The length of time depends on the medication injected and your individual response to the medication. If the numbness is not going away after 48 hours, call your surgeon. ° °2. The extremity that is blocked will need to be protected until the numbness is gone and the  Strength has returned. Because you cannot feel it, you will need to take extra care to avoid injury. Because it may be weak, you may have difficulty moving it or using it. You may not know   what position it is in without looking at it while the block is in effect. ° °3. For blocks in the legs and feet, returning to weight bearing and walking needs to be done carefully. You will need to wait until the numbness is entirely gone and the strength has returned. You should be able to move your leg and foot normally before you try and bear weight or walk. You will need someone  to be with you when you first try to ensure you do not fall and possibly risk injury. ° °4. Bruising and tenderness at the needle site are common side effects and will resolve in a few days. ° °5. Persistent numbness or new problems with movement should be communicated to the surgeon or the [facility] ([phone])/ [facility] ([phone]). °   ° ° ° ° °

## 2018-01-08 ENCOUNTER — Encounter (HOSPITAL_COMMUNITY): Payer: Self-pay | Admitting: Orthopedic Surgery

## 2018-01-08 ENCOUNTER — Other Ambulatory Visit: Payer: Self-pay | Admitting: Internal Medicine

## 2018-01-08 MED ORDER — BUPIVACAINE-EPINEPHRINE (PF) 0.5% -1:200000 IJ SOLN
INTRAMUSCULAR | Status: DC | PRN
  Administered 2018-01-07: 20 mL via PERINEURAL

## 2018-01-08 MED ORDER — BUPIVACAINE LIPOSOME 1.3 % IJ SUSP
INTRAMUSCULAR | Status: DC | PRN
  Administered 2018-01-07: 10 mL via PERINEURAL

## 2018-01-08 NOTE — Anesthesia Procedure Notes (Signed)
Anesthesia Regional Block: Interscalene brachial plexus block   Pre-Anesthetic Checklist: ,, timeout performed, Correct Patient, Correct Site, Correct Laterality, Correct Procedure, Correct Position, site marked, Risks and benefits discussed,  Surgical consent,  Pre-op evaluation,  At surgeon's request and post-op pain management  Laterality: Right  Prep: chloraprep       Needles:  Injection technique: Single-shot  Needle Type: Echogenic Stimulator Needle     Needle Length: 5cm  Needle Gauge: 21     Additional Needles:   Procedures:, nerve stimulator,,,,,,,   Nerve Stimulator or Paresthesia:  Response: 0.4 mA,   Additional Responses:   Narrative:  Start time: 01/07/2018 12:00 PM End time: 01/07/2018 12:11 PM Injection made incrementally with aspirations every 5 mL.  Performed by: Personally  Anesthesiologist: Lillia Abed, MD  Additional Notes: Monitors applied. Patient sedated. Sterile prep and drape,hand hygiene and sterile gloves were used. Relevant anatomy identified.Needle position confirmed.Local anesthetic injected incrementally after negative aspiration. Local anesthetic spread visualized around nerve(s). Vascular puncture avoided. No complications. Image printed for medical record.The patient tolerated the procedure well.

## 2018-01-08 NOTE — Addendum Note (Signed)
Addendum  created 01/08/18 1130 by Lillia Abed, MD   Child order released for a procedure order, Intraprocedure Blocks edited, Intraprocedure Meds edited, Sign clinical note

## 2018-04-20 ENCOUNTER — Ambulatory Visit: Payer: Medicare Other | Admitting: Internal Medicine

## 2018-04-20 ENCOUNTER — Encounter: Payer: Self-pay | Admitting: Internal Medicine

## 2018-04-20 VITALS — BP 94/58 | HR 66 | Ht 62.5 in | Wt 188.0 lb

## 2018-04-20 DIAGNOSIS — R0609 Other forms of dyspnea: Secondary | ICD-10-CM | POA: Diagnosis not present

## 2018-04-20 DIAGNOSIS — R600 Localized edema: Secondary | ICD-10-CM

## 2018-04-20 DIAGNOSIS — I10 Essential (primary) hypertension: Secondary | ICD-10-CM

## 2018-04-20 DIAGNOSIS — R0600 Dyspnea, unspecified: Secondary | ICD-10-CM

## 2018-04-20 NOTE — Patient Instructions (Addendum)
Medication Instructions:  Continue current medications If you need a refill on your cardiac medications before your next appointment, please call your pharmacy.   Lab work: NONE  Testing/Procedures: NONE  Follow-Up: At [facility], you and your health needs are our priority.  As part of our continuing mission to provide you with exceptional heart care, we have created designated Provider Care Teams.  These Care Teams include your primary Cardiologist (physician) and Advanced Practice Providers (APPs -  Physician Assistants and Nurse Practitioners) who all work together to provide you with the care you need, when you need it. You will need a follow up appointment in 12 months.  Please call our office 2 months in advance to schedule this appointment.  You may see Pixie Casino, MD or one of the following Advanced Practice Providers on your designated Care Team: [place], [place] . Fabian Sharp, PA-C  Any Other Special Instructions Will Be Listed Below (If Applicable).  Dr. Debara Pickett requests that you monitor your BP at home. Please check your BP 1-2 times daily. Please notify our office of your readings in about 1-2 weeks.

## 2018-04-20 NOTE — Progress Notes (Signed)
OFFICE NOTE  Chief Complaint:  Routine follow-up  Primary Care Physician: Amanda Blade, MD  HPI:  Amanda Bender is a pleasant 77 year old female with a number of medical problems. 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 Apparently she also has a history of murmur since she was a child and was once diagnosed with mitral valve prolapse in the past the. Her family history is significant mostly for cancer and some heart disease in her father. She was referred for evaluation of lower extremity swelling earlier in the year, for which he says is been occurring over the past several months to a year. The swelling worsens when she is on her feet and improves after elevating them at night. There is no history of varicose veins in the family and she has never been diagnosed with in the past. She does have some neuropathic type pain including pain in both heels, which is yet to have evaluated. She says is painful when she walks especially bearing weight on her heels. She is on a medications that are typically associated with lower extremity swelling. She is also not on a diuretic. Imaging was performed which was negative for any venous insufficiency, and she was diagnosed with lymphedema.   She was now referred back for evaluation of palpitations. She reports a recently she's been having an increase in fatigue, shortness of breath, especially when walking up stairs and associated palpitations. At times she feels like her heart races. She's tried coughing and bearing down to stop this but it has no effect on it. The episodes occur almost on a daily basis. She had previously seen Dr. Einar Bender and had worn a monitor for some period of time which apparently was unrevealing. She also reports chronic fatigue symptoms as well as myofascial pain which occurs in her bilateral upper arms and upper trapezius and lower  cervical spine areas as well as her legs. She reports episodes of paroxysmal pain, shortness of breath fatigue and rapid heart rate which could be signs of fibromyalgia.  Finally she reports palpitations that have improved with self treatment. She increased her metoprolol tartrate from once daily 50 mg to 50 mg in the morning and 25 mg at night. Then she started to increase it to 50 mg twice daily. She is currently taking this dose, but reports that in the early afternoon or early morning that her palpitations do "breakthrough".  Amanda Bender returns today for followup of her echocardiogram and monitor. The echocardiogram did demonstrate mild mitral regurgitation, grade 1 diastolic dysfunction and an EF of 65-70%. No wall motion abnormalities. Her monitor did demonstrate PVCs which were isolated and seem to come at various times throughout the day. There was no significant nonsustained ventricular tachycardia or other worrisome rhythms. She continues to complain of palpitations and shortness of breath with activities that previously did not bother her. Her course has been more weight gain recently as well. She continues to deny any chest pain.  Amanda Bender did undergo a metabolic test. This demonstrated a less than optimal effort with a decreased VO2 of 74%.  Peak heart rate was 68% predicted, and there was flattening of the heart rate and VO2 curves. There is a sharp rise in VO2 during free pedaling suggesting that her obesity is contributing to some of her shortness of breath. In addition, the flattening of her heart rate her suggest an element of medication related chronotropic  incompetence.  Unfortunately, she feels that she needs higher doses of beta blocker to suppress her PVCs. In fact she reports that her palpitations are improved on higher dose beta blocker and that it is actually improving her shortness of breath.  On followup today Amanda Bender has no new complaints. Her shortness of breath  is stable. Her leg swelling is still present but perhaps slightly better. She did not have a significant palpitations except when she does not get good rest.  I saw Amanda Bender back today in the office. She actually has numerous complaints. Her last exam was a couple of years ago. Today she is presenting with palpitations, worsening constant shortness of breath with minimal exertion, chest pressure, leg swelling, and has reported left temporal headache which is intermittent and not associated with vision changes. Her shortness of breath tends to be with minimal exertion and has been progressive over the past couple of months. Prior to this she had an upper respiratory infection and feels like she "has not completely gotten over it".  10/08/2015  Amanda Bender returns today for follow-up. She reports some mild improvement in shortness of breath. Her weight is down 5 pounds with the increase in Aldactone to 50 mg daily. She says her swelling is resolved. Her BNP was low at 80. Renal function is stable and potassium is normal. She continues to have some shortness of breath however and cough. In the past she had seen Amanda Bender. I advised her that he is no longer with with our pulmonary, but she would like a referral back to pulmonary for reevaluation. Blood pressure appears well controlled today. Her nuclear stress test was low risk with an EF of 80% and no evidence of ischemia. She still gets occasional temporal headaches which may be cluster headaches. Her sedimentation rate was low, not being suggestive of temporal arteritis.  05/07/2016  Amanda Bender returns for follow-up. She has seen Amanda Bender with pulmonary in follow-up and he diagnosed her with bronchiectasis. 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 She was treated with antibiotics which she says helped her breathing, but now she is bothered by frequent incontinence. She denies any chest pain.  05/08/2017  Amanda Bender was  seen today in follow-up.  She reports of occasional shortness of breath which has been stable.  She has a history of bronchiectasis.  She denies any chest pain.  Blood pressure is well controlled 120/60.  She occasionally gets some leg swelling.  She said she had had some leg swelling last night but it improved in the morning.  She may have increased her salt intake recently inadvertently.  EKG shows sinus rhythm at 62 without ischemic changes.  04/20/2018  Amanda Bender returns today for annual follow-up.  Over the past year she is had some fatigue, particularly within the last few days.  Blood pressure was noted to be low today at 94/58.  Is not clear whether this is a trend or just an outlier today.  She denies any chest pain.  She does get some shortness of breath only with moderate exertion.  She is on 3 different blood pressure medications.  She is also noted some improvement in her swelling which is possibly attributable to spironolactone.  PMHx:  Past Medical History:  Diagnosis Date  . Anemia    low iron  . Anxiety   . Arthritis   . Bronchiectasis ([place])   . Complex renal cyst   . Complication of anesthesia  slow to wake up  . Cramps, extremity   . Diverticulosis   . Dyspnea    with exertion  . Esophageal ulcer   . GERD (gastroesophageal reflux disease)   . Headache    migraines in the past - none since menopause  . Heart murmur   . History of hiatal hernia   . Hypertension   . Hyperthyroidism    as been treated (Graves disease) now hypothyroid  . Hypothyroidism   . Mitral prolapse   . Pneumonia   . PVC's (premature ventricular contractions)   . Thyroid disease     Past Surgical History:  Procedure Laterality Date  . ABDOMINAL HYSTERECTOMY    . APPENDECTOMY    . BUNIONECTOMY Bilateral   . COLONOSCOPY WITH ESOPHAGOGASTRODUODENOSCOPY (EGD)    . EYE SURGERY Left    cataract surgery with lens implant  . LAPAROSCOPY FOR ECTOPIC PREGNANCY    . SHOULDER ARTHROSCOPY  WITH ROTATOR CUFF REPAIR AND SUBACROMIAL DECOMPRESSION Right 01/07/2018   Procedure: SHOULDER ARTHROSCOPY WITH ROTATOR CUFF REPAIR AND SUBACROMIAL DECOMPRESSION;  Surgeon: Tania Ade, MD;  Location: [facility];  Service: Orthopedics;  Laterality: Right;    FAMHx:  Family History  Problem Relation Age of Onset  . Cancer Maternal Uncle   . Cancer Maternal Grandfather   . Tuberculosis Mother   . Tuberculosis Brother   . Mesothelioma Brother     SOCHx:   reports that she quit smoking about 56 years ago. Her smoking use included cigarettes. She has a 1.00 pack-year smoking history. She has never used smokeless tobacco. She reports that she does not drink alcohol or use drugs.  ALLERGIES:  Allergies  Allergen Reactions  . Sulfa Antibiotics Hives  . Doxycycline     Urinary incontinence  . Iohexol      Code: HIVES, Desc: pt states hives w/IVP dye several yrs ago, pt premedicated w/50mg  benadryl 1 hr prior to scan per AmandaStroud and pt was fine   . Sulfonamide Derivatives     ROS: Pertinent items noted in HPI and remainder of comprehensive ROS otherwise negative.  HOME MEDS: Current Outpatient Medications  Medication Sig Dispense Refill  . albuterol (PROVENTIL HFA;VENTOLIN HFA) 108 (90 Base) MCG/ACT inhaler Inhale 2 puffs into the lungs every 4 (four) hours as needed for wheezing or shortness of breath.    Marland Kitchen albuterol (PROVENTIL) (2.5 MG/3ML) 0.083% nebulizer solution Take 3 mLs (2.5 mg total) by nebulization every 6 (six) hours as needed for wheezing or shortness of breath. 360 mL 11  . Carboxymeth-Glyc-Polysorb PF (REFRESH OPTIVE MEGA-3) 0.5-1-0.5 % SOLN Place 1 drop into both eyes 4 (four) times daily.    . Cholecalciferol (VITAMIN D-3) 5000 units TABS Take 5,000 Units by mouth daily.    . cimetidine (TAGAMET) 300 MG tablet Take 300 mg by mouth 2 (two) times daily.    . fenofibrate 160 MG tablet Take 160 mg by mouth daily.    Marland Kitchen HYDROcodone-acetaminophen (NORCO) 5-325 MG tablet Take  1-2 tablets by mouth every 4 (four) hours as needed for moderate pain. 30 tablet 0  . Iron-FA-B Cmp-C-Biot-Probiotic (FUSION PLUS PO) Take 1 tablet by mouth daily.    . metoprolol succinate (TOPROL-XL) 50 MG 24 hr tablet TAKE 1 TABLET BY MOUTH ONCE DAILY 90 tablet 1  . olmesartan (BENICAR) 40 MG tablet Take 40 mg by mouth daily.    Marland Kitchen omeprazole (PRILOSEC) 40 MG capsule Take 40 mg by mouth daily.    Marland Kitchen OVER THE COUNTER MEDICATION Apply 1  application topically daily as needed (cramping). Magnesium topical cream - Magne Sport - uses for cramping    . OVER THE COUNTER MEDICATION Apply 1 application topically daily as needed (shoulder pain). Magne Hemp    . Probiotic Product (PROBIOTIC DAILY PO) Take 1 capsule by mouth 2 (two) times daily.    Marland Kitchen Respiratory Therapy Supplies (FLUTTER) DEVI Use as directed 1 each 0  . sodium chloride HYPERTONIC 3 % nebulizer solution Take by nebulization as needed for other. (Patient taking differently: Take 4 mLs by nebulization as needed for other. ) 750 mL 11  . spironolactone (ALDACTONE) 50 MG tablet Take 1 tablet (50 mg total) by mouth daily. 90 tablet 3  . thyroid (NP THYROID) 60 MG tablet Take 60 mg by mouth daily before breakfast. *NP THYROID*    . valACYclovir (VALTREX) 500 MG tablet Take 500 mg by mouth daily.     No current facility-administered medications for this visit.     LABS/IMAGING: No results found for this or any previous visit (from the past 48 hour(s)). No results found.  VITALS: BP (!) 94/58   Pulse 66   Ht 5' 2.5" (1.588 m)   Wt 188 lb (85.3 kg)   BMI 33.84 kg/m   EXAM: General appearance: alert and no distress Neck: no carotid bruit, no JVD and thyroid not enlarged, symmetric, no tenderness/mass/nodules Lungs: clear to auscultation bilaterally Heart: regular rate and rhythm, S1, S2 normal and systolic murmur: early systolic 2/6, blowing at apex Abdomen: soft, non-tender; bowel sounds normal; no masses,  no organomegaly Extremities:  extremities normal, atraumatic, no cyanosis or edema Pulses: 2+ and symmetric Skin: Skin color, texture, turgor normal. No rashes or lesions Neurologic: Grossly normal Psych: Pleasant   EKG: Normal sinus rhythm with sinus arrhythmia at 66-personally reviewed  ASSESSMENT: 1. DOE, related to bronchiectasis - prior TB exposure - normal nuclear stress test with EF 80% 2. Murmur - no significant echo findings (2017, normal LVEF 65-70%, grade 1 DD) 3. Frequent palpitaitons 4. Fatigue/chronic myofascial pain 5. Left temporal headache 6. Leg edema  PLAN: 1.   Amanda Bender has recently had an increase in fatigue and shortness of breath.  Her blood pressure is notably low today.  I advised her to monitor that we may consider decreasing her ARB dose.  Otherwise no changes to her medicines today.  Follow-up with me annually or sooner as necessary.  Pixie Casino, MD, [facility], [place] Director of the [facility]  [facility] Attending Cardiologist  Direct Dial: [phone]  Fax: [phone]  Website:  [email or web address] Pickford 04/20/2018, 10:43 AM

## 2018-05-28 IMAGING — CT CT CHEST W/O CM
2 of 3 series · 15 of 36 positions shown, 18 images · non-contrast
Comparison: 07/09/2010.

CLINICAL DATA: Right base and right midlung subsegmental
atelectasis. Shortness of breath.

EXAM:
CT CHEST WITHOUT CONTRAST
TECHNIQUE: Multidetector CT imaging of the chest was performed following the
standard protocol without IV contrast.

[Series 2: thorax · axial · 0.68mm/px · z∈[-268,-22]mm · 12 of 145 slices shown, 15 images]
[im 11/145  mediastinal]
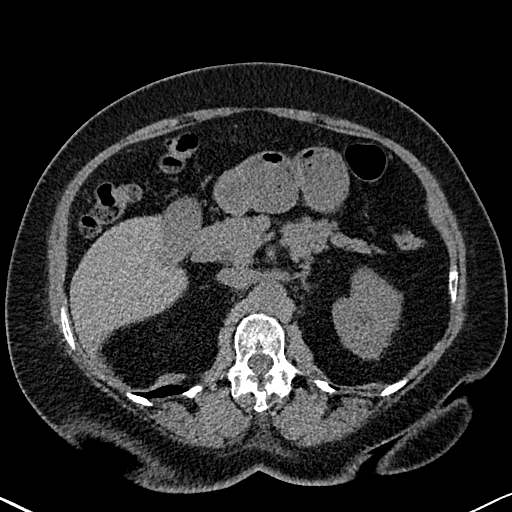
[im 11/145  lung]
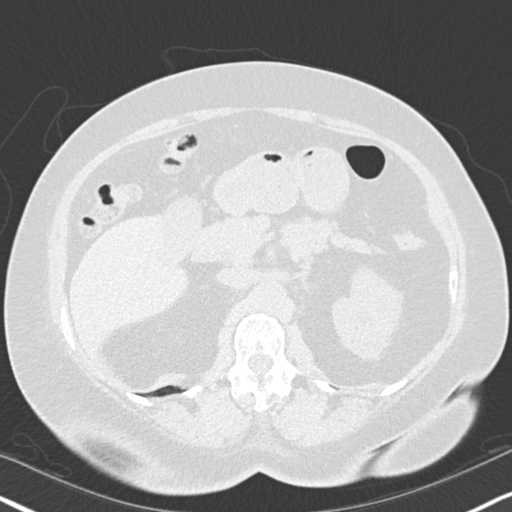
[im 22/145  lung]
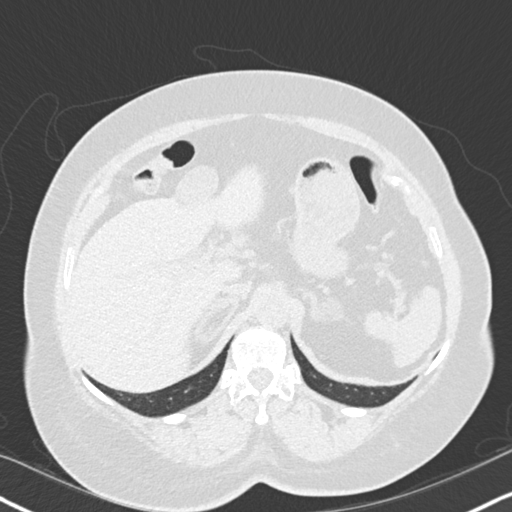
[im 33/145  lung]
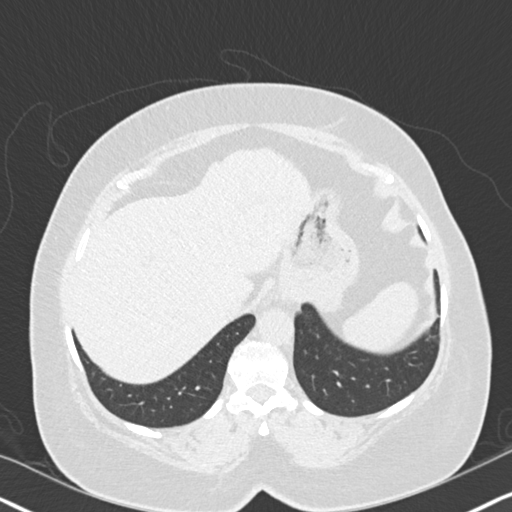
[im 43/145  lung]
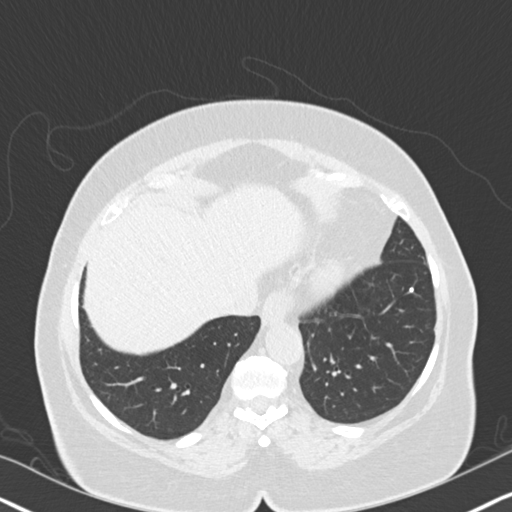
[im 54/145  mediastinal]
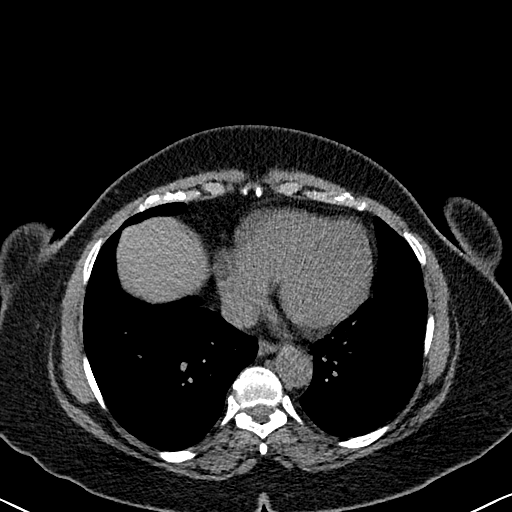
[im 54/145  lung]
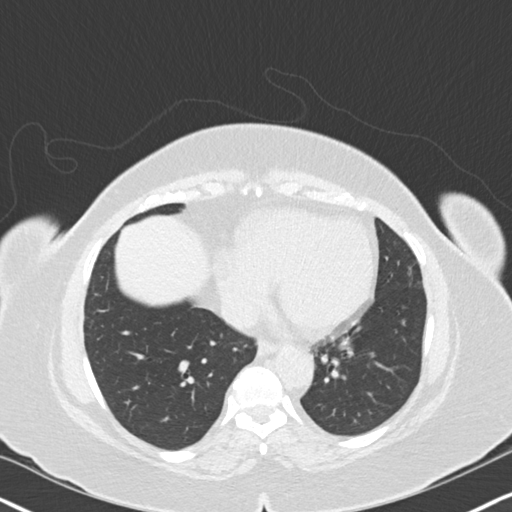
[im 65/145  lung]
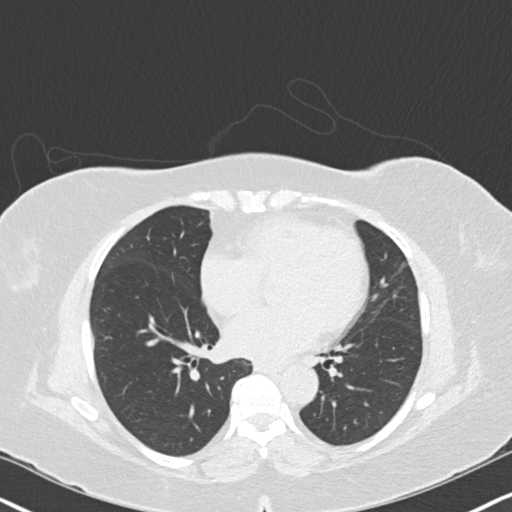
[im 81/145  lung]
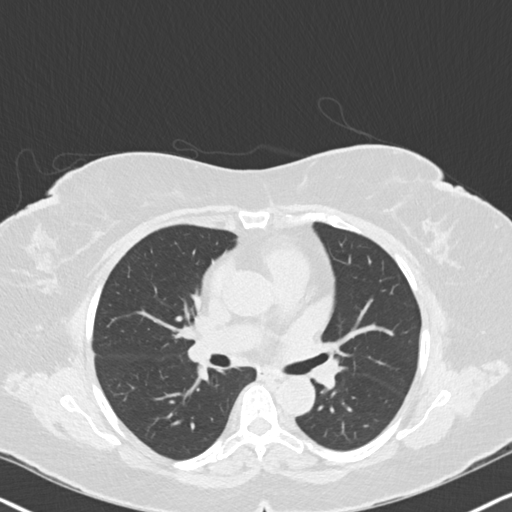
[im 91/145  lung]
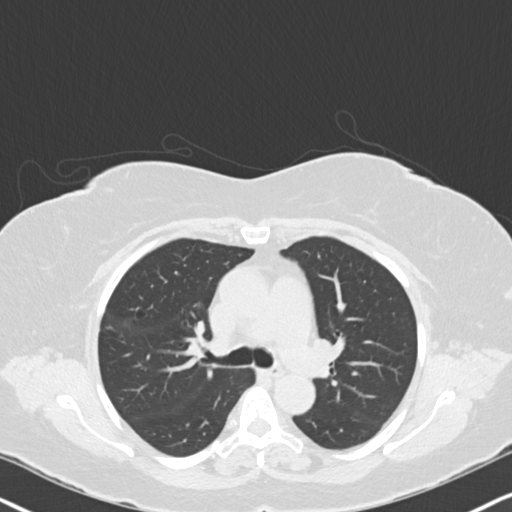
[im 102/145  mediastinal]
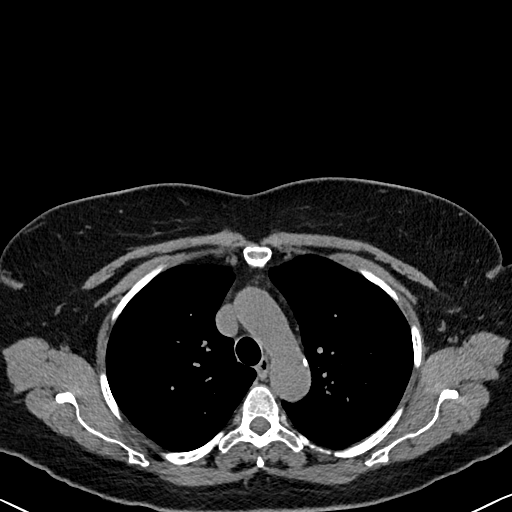
[im 102/145  lung]
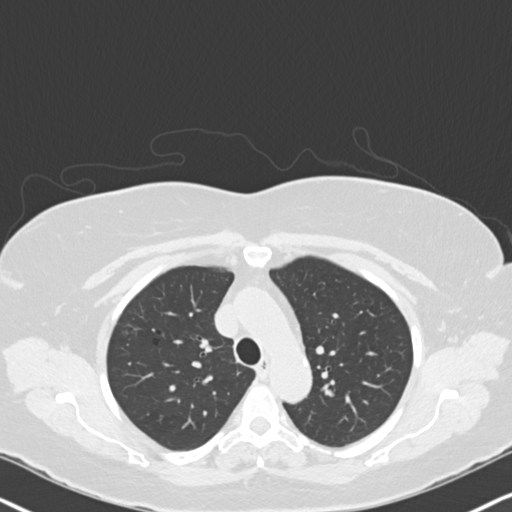
[im 113/145  lung]
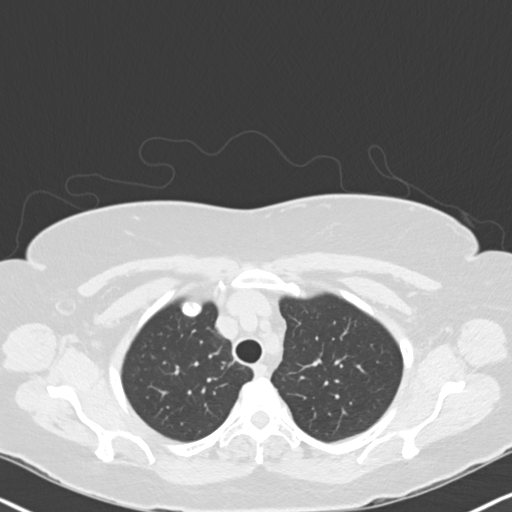
[im 123/145  lung]
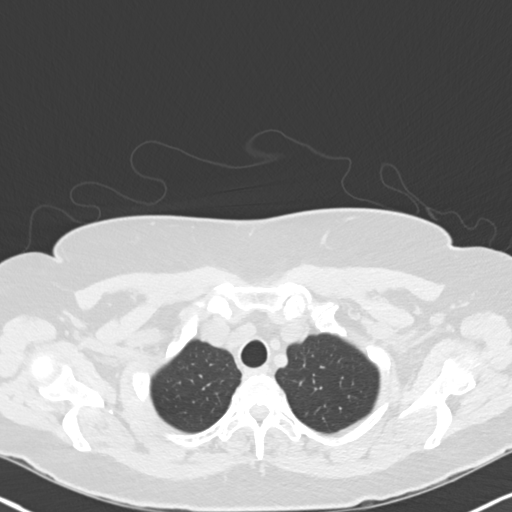
[im 134/145  lung]
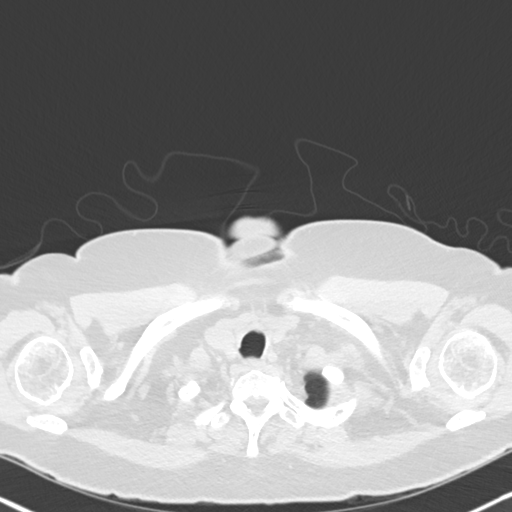

[Series 5: coronal · coronal · 0.53mm/px · 3 of 110 slices shown]
[im 22/110  lung]
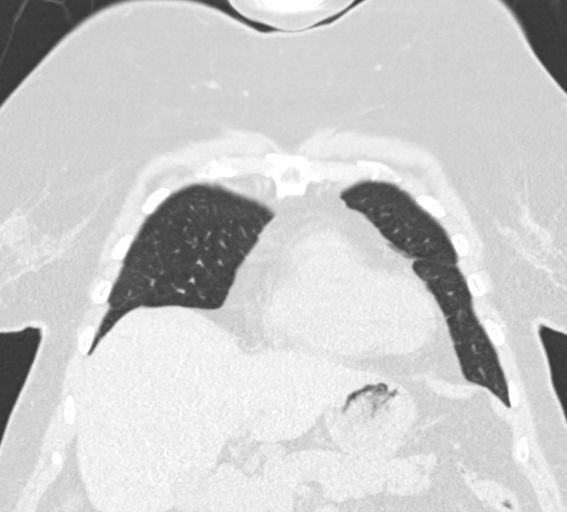
[im 44/110  lung]
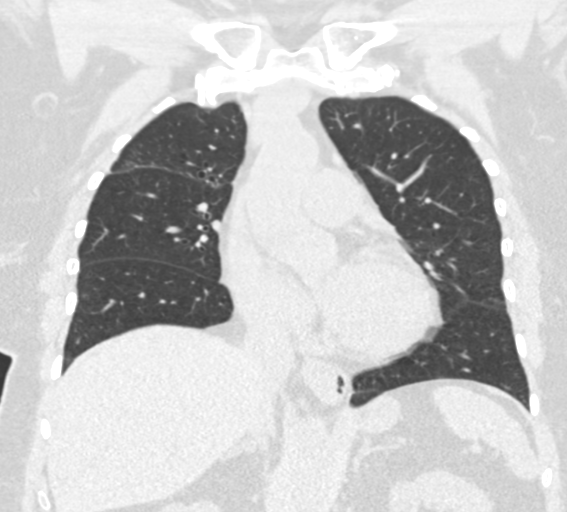
[im 66/110  lung]
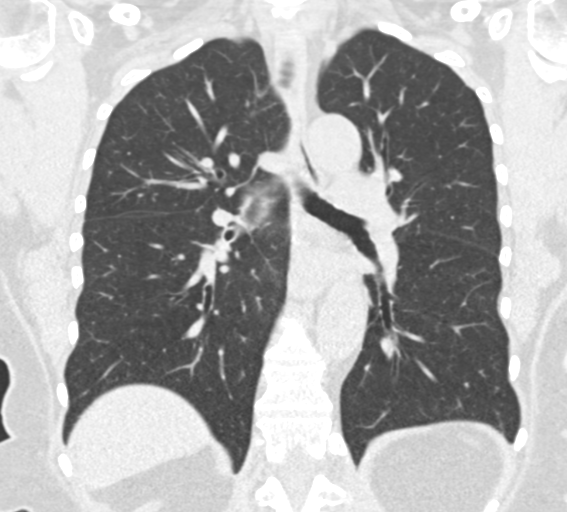

[15 of 36 positions shown; findings below may reference images not displayed]

FINDINGS: Cardiovascular: Normal heart size. No pericardial effusion
identified. Aortic atherosclerosis noted.

Mediastinum: The trachea appears patent and is midline. Normal
appearance of the esophagus. No mediastinal or hilar adenopathy.

Lungs/Pleura: No pleural fluid identified. Bronchiectasis and scar
noted involving the right upper lobe, image number 53 of series 3.
Calcified granuloma is identified in the right middle lobe. Scar
like density is identified within the lateral left base, image 108
of series 3.

Upper Abdomen: The adrenal glands are normal. Unremarkable
appearance of the visualized portions of the liver. Normal
appearance of the spleen. Bilateral renal cysts are incompletely
characterized without IV contrast. Nonobstructing calculus within
the inferior pole of the right kidney measures 6 mm.

Musculoskeletal: Degenerative disc disease is noted within the
lumbar spine. No aggressive lytic or sclerotic bone lesions.
IMPRESSION: 1. Right upper lobe scar and bronchiectasis is identified, likely
postinflammatory or infectious.
2. Right middle lobe granuloma.
3. Aortic atherosclerosis.

## 2018-06-02 ENCOUNTER — Other Ambulatory Visit: Payer: Self-pay | Admitting: Internal Medicine

## 2018-06-02 DIAGNOSIS — I10 Essential (primary) hypertension: Secondary | ICD-10-CM

## 2018-07-23 ENCOUNTER — Emergency Department (HOSPITAL_COMMUNITY)
Admission: EM | Admit: 2018-07-23 | Discharge: 2018-07-23 | Disposition: A | Discharge status: Home / Self Care | Payer: Medicare Other | Attending: Emergency Medicine | Admitting: Emergency Medicine

## 2018-07-23 ENCOUNTER — Encounter (HOSPITAL_COMMUNITY): Payer: Self-pay | Admitting: Emergency Medicine

## 2018-07-23 ENCOUNTER — Emergency Department (HOSPITAL_COMMUNITY): Payer: Medicare Other

## 2018-07-23 DIAGNOSIS — R799 Abnormal finding of blood chemistry, unspecified: Secondary | ICD-10-CM | POA: Diagnosis present

## 2018-07-23 DIAGNOSIS — R0602 Shortness of breath: Secondary | ICD-10-CM | POA: Diagnosis not present

## 2018-07-23 DIAGNOSIS — Z79899 Other long term (current) drug therapy: Secondary | ICD-10-CM | POA: Insufficient documentation

## 2018-07-23 DIAGNOSIS — E039 Hypothyroidism, unspecified: Secondary | ICD-10-CM | POA: Insufficient documentation

## 2018-07-23 DIAGNOSIS — R5383 Other fatigue: Secondary | ICD-10-CM | POA: Insufficient documentation

## 2018-07-23 DIAGNOSIS — Z87891 Personal history of nicotine dependence: Secondary | ICD-10-CM | POA: Diagnosis not present

## 2018-07-23 DIAGNOSIS — I10 Essential (primary) hypertension: Secondary | ICD-10-CM | POA: Diagnosis not present

## 2018-07-23 DIAGNOSIS — N179 Acute kidney failure, unspecified: Secondary | ICD-10-CM

## 2018-07-23 LAB — CBC
HEMATOCRIT: 35.9 % — AB (ref 36.0–46.0)
HEMOGLOBIN: 10.5 g/dL — AB (ref 12.0–15.0)
MCH: 25.9 pg — ABNORMAL LOW (ref 26.0–34.0)
MCHC: 29.2 g/dL — AB (ref 30.0–36.0)
MCV: 88.4 fL (ref 80.0–100.0)
Platelets: 253 10*3/uL (ref 150–400)
RBC: 4.06 MIL/uL (ref 3.87–5.11)
RDW: 15.2 % (ref 11.5–15.5)
WBC: 7 10*3/uL (ref 4.0–10.5)
nRBC: 0 % (ref 0.0–0.2)

## 2018-07-23 LAB — COMPREHENSIVE METABOLIC PANEL
ALT: 21 U/L (ref 0–44)
AST: 24 U/L (ref 15–41)
Albumin: 3.6 g/dL (ref 3.5–5.0)
Alkaline Phosphatase: 30 U/L — ABNORMAL LOW (ref 38–126)
Anion gap: 8 (ref 5–15)
BUN: 61 mg/dL — AB (ref 8–23)
CHLORIDE: 114 mmol/L — AB (ref 98–111)
CO2: 19 mmol/L — ABNORMAL LOW (ref 22–32)
Calcium: 9 mg/dL (ref 8.9–10.3)
Creatinine, Ser: 2.11 mg/dL — ABNORMAL HIGH (ref 0.44–1.00)
GFR calc Af Amer: 26 mL/min — ABNORMAL LOW (ref >60)
GFR, EST NON AFRICAN AMERICAN: 22 mL/min — AB (ref >60)
Glucose, Bld: 104 mg/dL — ABNORMAL HIGH (ref 70–99)
POTASSIUM: 5 mmol/L (ref 3.5–5.1)
SODIUM: 141 mmol/L (ref 135–145)
Total Bilirubin: 0.4 mg/dL (ref 0.3–1.2)
Total Protein: 6.1 g/dL — ABNORMAL LOW (ref 6.5–8.1)

## 2018-07-23 LAB — URINALYSIS, ROUTINE W REFLEX MICROSCOPIC
BILIRUBIN URINE: NEGATIVE
Glucose, UA: NEGATIVE mg/dL
Hgb urine dipstick: NEGATIVE
Ketones, ur: NEGATIVE mg/dL
LEUKOCYTE UA: NEGATIVE
NITRITE: NEGATIVE
PH: 5 (ref 5.0–8.0)
Protein, ur: NEGATIVE mg/dL
SPECIFIC GRAVITY, URINE: 1.016 (ref 1.005–1.030)

## 2018-07-23 LAB — LIPASE, BLOOD: LIPASE: 45 U/L (ref 11–51)

## 2018-07-23 MED ORDER — SODIUM CHLORIDE 0.9 % IV BOLUS (SEPSIS)
500.0000 mL | Freq: Once | INTRAVENOUS | Status: AC
  Administered 2018-07-23: 500 mL via INTRAVENOUS

## 2018-07-23 MED ORDER — SODIUM CHLORIDE 0.9 % IV SOLN
1000.0000 mL | INTRAVENOUS | Status: DC
  Administered 2018-07-23: 1000 mL via INTRAVENOUS

## 2018-07-23 NOTE — Discharge Instructions (Signed)
Stop taking your olmesartan.  Make sure to avoid any NSAID type medication such as naprosyn, ibuprofen, meloxicam.  Follow up with your doctor next week to have it rechecked.  If it remains elevated the kidney doctor recommends a referral to them.

## 2018-07-23 NOTE — ED Notes (Signed)
Urine culture sent down to lab with urinalysis. 

## 2018-07-23 NOTE — ED Provider Notes (Signed)
[facility] DEPT Provider Note   CSN: [id number] Arrival date & time: 07/23/18  1439     History   Chief Complaint Chief Complaint  Patient presents with  . elevated CRT    HPI Amanda Bender is a 78 y.o. female.  HPI Patient was sent to the emergency room for abnormal blood tests.  Patient states she has had some issues with feeling fatigued and having some shortness of breath with activity over the last couple weeks.  She went to her primary care doctor to be evaluated and had some laboratory test performed.  Patient states she has had a couple of soft stools but no frequent diarrhea.  She has not been having any issues with nausea or vomiting.  She denies any trouble with chest pain.  No fevers.  No abdominal pain.  No headaches.  Patient denies any changes in her medications.  Her doctor called her and told her that her creatinine was elevated.  She instructed her to come to the ED. Past Medical History:  Diagnosis Date  . Anemia    low iron  . Anxiety   . Arthritis   . Bronchiectasis ([place])   . Complex renal cyst   . Complication of anesthesia    slow to wake up  . Cramps, extremity   . Diverticulosis   . Dyspnea    with exertion  . Esophageal ulcer   . GERD (gastroesophageal reflux disease)   . Headache    migraines in the past - none since menopause  . Heart murmur   . History of hiatal hernia   . Hypertension   . Hyperthyroidism    as been treated (Graves disease) now hypothyroid  . Hypothyroidism   . Mitral prolapse   . Pneumonia   . PVC's (premature ventricular contractions)   . Thyroid disease     Patient Active Problem List   Diagnosis Date Noted  . Abnormal glucose level 05/08/2017  . Hyperlipidemia 05/08/2017  . Osteopenia 05/08/2017  . Vitamin D deficiency 05/08/2017  . Obesity 10/06/2016  . Bronchiectasis without acute exacerbation ([place]) 01/08/2016  . GERD (gastroesophageal reflux disease) 11/08/2015  .  Urinary incontinence 03/07/2015  . Palpitations 06/23/2013  . DOE (dyspnea on exertion) 06/23/2013  . Fatigue 06/23/2013  . Chronic myofascial pain 06/23/2013  . Bilateral edema of lower extremity 11/25/2012  . Sinus congestion 08/26/2011  . Cough 08/26/2011  . GRAVES' DISEASE 01/26/2009  . HYPOTHYROIDISM 01/26/2009  . Essential hypertension 01/26/2009  . Mitral valve disorder 01/26/2009  . ALLERGIC RHINITIS 01/26/2009  . PULMONARY NODULE 01/26/2009  . CARDIAC MURMUR 01/26/2009    Past Surgical History:  Procedure Laterality Date  . ABDOMINAL HYSTERECTOMY    . APPENDECTOMY    . BUNIONECTOMY Bilateral   . COLONOSCOPY WITH ESOPHAGOGASTRODUODENOSCOPY (EGD)    . EYE SURGERY Left    cataract surgery with lens implant  . LAPAROSCOPY FOR ECTOPIC PREGNANCY    . SHOULDER ARTHROSCOPY WITH ROTATOR CUFF REPAIR AND SUBACROMIAL DECOMPRESSION Right 01/07/2018   Procedure: SHOULDER ARTHROSCOPY WITH ROTATOR CUFF REPAIR AND SUBACROMIAL DECOMPRESSION;  Surgeon: Tania Ade, MD;  Location: [facility];  Service: Orthopedics;  Laterality: Right;     OB History   No obstetric history on file.      Home Medications    Prior to Admission medications   Medication Sig Start Date End Date Taking? Authorizing Provider  albuterol (PROVENTIL HFA;VENTOLIN HFA) 108 (90 Base) MCG/ACT inhaler Inhale 2 puffs into the lungs every  4 (four) hours as needed for wheezing or shortness of breath.    [provider]  albuterol (PROVENTIL) (2.5 MG/3ML) 0.083% nebulizer solution Take 3 mLs (2.5 mg total) by nebulization every 6 (six) hours as needed for wheezing or shortness of breath. 08/01/16   Juanito Doom, MD  Carboxymeth-Glyc-Polysorb PF (REFRESH OPTIVE MEGA-3) 0.5-1-0.5 % SOLN Place 1 drop into both eyes 4 (four) times daily.    [provider]  Cholecalciferol (VITAMIN D-3) 5000 units TABS Take 5,000 Units by mouth daily.    [provider]  fenofibrate 160 MG tablet Take 160 mg by  mouth daily.    [provider]  HYDROcodone-acetaminophen (NORCO) 5-325 MG tablet Take 1-2 tablets by mouth every 4 (four) hours as needed for moderate pain. 01/07/18   Grier Mitts, PA-C  Iron-FA-B Cmp-C-Biot-Probiotic (FUSION PLUS PO) Take 1 tablet by mouth daily.    [provider]  levothyroxine (SYNTHROID, LEVOTHROID) 25 MCG tablet TK 1 T PO QD 05/12/18   [provider]  metoprolol succinate (TOPROL-XL) 50 MG 24 hr tablet TAKE 1 TABLET BY MOUTH ONCE DAILY 01/08/18   Hilty, Nadean Corwin, MD  OVER THE COUNTER MEDICATION Apply 1 application topically daily as needed (cramping). Magnesium topical cream - Magne Sport - uses for cramping    [provider]  OVER THE COUNTER MEDICATION Apply 1 application topically daily as needed (shoulder pain). Magne Hemp    [provider]  Probiotic Product (PROBIOTIC DAILY PO) Take 1 capsule by mouth 2 (two) times daily.    [provider]  Respiratory Therapy Supplies (FLUTTER) DEVI Use as directed 01/08/16   Juanito Doom, MD  sodium chloride HYPERTONIC 3 % nebulizer solution Take by nebulization as needed for other. Patient taking differently: Take 4 mLs by nebulization as needed for other.  08/01/16   Juanito Doom, MD  spironolactone (ALDACTONE) 50 MG tablet TAKE 1 TABLET BY MOUTH ONCE DAILY 06/03/18   Hilty, Nadean Corwin, MD  thyroid (NP THYROID) 60 MG tablet Take 60 mg by mouth daily before breakfast. *NP THYROID*    [provider]  valACYclovir (VALTREX) 500 MG tablet Take 500 mg by mouth daily.    [provider]    Family History Family History  Problem Relation Age of Onset  . Cancer Maternal Uncle   . Cancer Maternal Grandfather   . Tuberculosis Mother   . Tuberculosis Brother   . Mesothelioma Brother     Social History Social History   Tobacco Use  . Smoking status: Former Smoker    Packs/day: 1.00    Years: 1.00    Pack years: 1.00    Types: Cigarettes     Last attempt to quit: 06/09/1961    Years since quitting: 57.1  . Smokeless tobacco: Never Used  . Tobacco comment: social smoker- never a habit.    Substance Use Topics  . Alcohol use: No    Alcohol/week: 0.0 standard drinks  . Drug use: No     Allergies   Sulfa antibiotics; Doxycycline; Iohexol; and Sulfonamide derivatives   Review of Systems Review of Systems  All other systems reviewed and are negative.    Physical Exam Updated Vital Signs BP (!) 127/50 (BP Location: Left Arm)   Pulse 66   Temp 98.2 F (36.8 C) (Oral)   Resp 17   SpO2 100%   Physical Exam Vitals signs and nursing note reviewed.  Constitutional:      General: She is  not in acute distress.    Appearance: She is well-developed.  HENT:     Head: Normocephalic and atraumatic.     Right Ear: External ear normal.     Left Ear: External ear normal.  Eyes:     General: No scleral icterus.       Right eye: No discharge.        Left eye: No discharge.     Conjunctiva/sclera: Conjunctivae normal.  Neck:     Musculoskeletal: Neck supple.     Trachea: No tracheal deviation.  Cardiovascular:     Rate and Rhythm: Normal rate and regular rhythm.  Pulmonary:     Effort: Pulmonary effort is normal. No respiratory distress.     Breath sounds: Normal breath sounds. No stridor. No wheezing or rales.  Abdominal:     General: Bowel sounds are normal. There is no distension.     Palpations: Abdomen is soft.     Tenderness: There is no abdominal tenderness. There is no guarding or rebound.  Musculoskeletal:        General: No tenderness.  Skin:    General: Skin is warm and dry.     Findings: No rash.  Neurological:     Mental Status: She is alert.     Cranial Nerves: No cranial nerve deficit (no facial droop, extraocular movements intact, no slurred speech).     Sensory: No sensory deficit.     Motor: No abnormal muscle tone or seizure activity.     Coordination: Coordination normal.      ED Treatments  / Results  Labs (all labs ordered are listed, but only abnormal results are displayed) Labs Reviewed  COMPREHENSIVE METABOLIC PANEL - Abnormal; Notable for the following components:      Result Value   Chloride 114 (*)    CO2 19 (*)    Glucose, Bld 104 (*)    BUN 61 (*)    Creatinine, Ser 2.11 (*)    Total Protein 6.1 (*)    Alkaline Phosphatase 30 (*)    GFR calc non Af Amer 22 (*)    GFR calc Af Amer 26 (*)    All other components within normal limits  CBC - Abnormal; Notable for the following components:   Hemoglobin 10.5 (*)    HCT 35.9 (*)    MCH 25.9 (*)    MCHC 29.2 (*)    All other components within normal limits  LIPASE, BLOOD  URINALYSIS, ROUTINE W REFLEX MICROSCOPIC    EKG EKG Interpretation  Date/Time:  Friday July 23 2018 20:24:52 EST Ventricular Rate:  68 PR Interval:    QRS Duration: 113 QT Interval:  391 QTC Calculation: 416 R Axis:   34 Text Interpretation:  Sinus rhythm Consider left atrial enlargement Borderline intraventricular conduction delay Low voltage, precordial leads Abnormal R-wave progression, early transition No significant change since last tracing Confirmed by Dorie Rank [phone]) on 07/23/2018 8:30:17 PM   Radiology Dg Chest 2 View  Result Date: 07/23/2018 CLINICAL DATA:  Shortness of breath. EXAM: CHEST - 2 VIEW COMPARISON:  August 01, 2016 FINDINGS: The heart size and mediastinal contours are within normal limits. There is no focal infiltrate, pulmonary edema, or pleural effusion. The visualized skeletal structures are stable. IMPRESSION: No active cardiopulmonary disease. Electronically Signed   By: Abelardo Diesel M.D.   On: 07/23/2018 20:11    Procedures Procedures (including critical care time)  Medications Ordered in ED Medications  sodium chloride 0.9 % bolus 500  mL (500 mLs Intravenous New Bag/Given 07/23/18 2008)    Followed by  0.9 %  sodium chloride infusion (has no administration in time range)     Initial  Impression / Assessment and Plan / ED Course  I have reviewed the triage vital signs and the nursing notes.  Pertinent labs & imaging results that were available during my care of the patient were reviewed by me and considered in my medical decision making (see chart for details).  Clinical Course as of Jul 23 2098  Fri Jul 23, 2018  2058 DIscussed case with Dr Johnney Ou.  Will stop olmesartan.  Follow up with PCP next week.  If still elevated refer to nephrology   [JK]    Clinical Course User Index [JK] Dorie Rank, MD    Patient presents to the ED for evaluation of elevated creatinine.  Patient appears well in the emergency room.  She would prefer to go home.  Labs do show any acute kidney injury.  No signs of urinary tract infection.  No signs of pulmonary edema.  Patient did have some soft stools but does not mention having frequent diarrhea.  She has not had any nausea or vomiting.  Patient was hydrated in the emergency room.  She is on olmesartan which may be contributing to this acute kidney injury.  I will have her stop the medication.  I also discussed not taking any NSAID type drug.  Discussed the case with Dr. Johnney Ou, nephrology.  Patient will need to have her creatinine rechecked next week.  If it remains elevated then she will need a referral to nephrology. Final Clinical Impressions(s) / ED Diagnoses   Final diagnoses:  AKI (acute kidney injury) [facility])    ED Discharge Orders    None       Dorie Rank, MD 07/23/18 2100

## 2018-07-23 NOTE — ED Triage Notes (Signed)
Pt reports had blood work at PCP yesterday and was called today and instructed to go to ED due to elevated CRT and being dehydrated. Reports had some diarrhea.

## 2018-08-01 ENCOUNTER — Other Ambulatory Visit: Payer: Self-pay | Admitting: Internal Medicine

## 2018-11-09 ENCOUNTER — Telehealth: Payer: Self-pay | Admitting: *Deleted

## 2018-11-09 NOTE — Telephone Encounter (Signed)
Called and left the patient a message to call the office back. Need to schedule the patient for a new patient appt  

## 2018-11-10 ENCOUNTER — Telehealth: Payer: Self-pay | Admitting: *Deleted

## 2018-11-10 NOTE — Telephone Encounter (Signed)
Patient called back and scheduled the referral appt for 6/10. Gave the patient the appt date/time, instructions and procedures

## 2018-11-10 NOTE — Telephone Encounter (Signed)
Called and left the patient a message to call the office back. Need to schedule a new patient appt  

## 2018-11-17 ENCOUNTER — Encounter: Payer: Self-pay | Admitting: Gynecologic Oncology

## 2018-11-17 ENCOUNTER — Inpatient Hospital Stay: Payer: Medicare Other | Attending: Gynecologic Oncology | Admitting: Gynecologic Oncology

## 2018-11-17 ENCOUNTER — Other Ambulatory Visit: Payer: Self-pay

## 2018-11-17 VITALS — BP 148/66 | HR 70 | Temp 98.3°F | Resp 17 | Ht 62.5 in | Wt 188.6 lb

## 2018-11-17 DIAGNOSIS — N949 Unspecified condition associated with female genital organs and menstrual cycle: Secondary | ICD-10-CM

## 2018-11-17 DIAGNOSIS — E039 Hypothyroidism, unspecified: Secondary | ICD-10-CM | POA: Insufficient documentation

## 2018-11-17 DIAGNOSIS — N83201 Unspecified ovarian cyst, right side: Secondary | ICD-10-CM | POA: Diagnosis present

## 2018-11-17 DIAGNOSIS — I341 Nonrheumatic mitral (valve) prolapse: Secondary | ICD-10-CM | POA: Diagnosis not present

## 2018-11-17 DIAGNOSIS — Z6834 Body mass index (BMI) 34.0-34.9, adult: Secondary | ICD-10-CM | POA: Insufficient documentation

## 2018-11-17 DIAGNOSIS — R011 Cardiac murmur, unspecified: Secondary | ICD-10-CM | POA: Insufficient documentation

## 2018-11-17 DIAGNOSIS — Z90710 Acquired absence of both cervix and uterus: Secondary | ICD-10-CM | POA: Diagnosis not present

## 2018-11-17 DIAGNOSIS — M1990 Unspecified osteoarthritis, unspecified site: Secondary | ICD-10-CM | POA: Insufficient documentation

## 2018-11-17 DIAGNOSIS — K219 Gastro-esophageal reflux disease without esophagitis: Secondary | ICD-10-CM | POA: Insufficient documentation

## 2018-11-17 DIAGNOSIS — I10 Essential (primary) hypertension: Secondary | ICD-10-CM | POA: Diagnosis not present

## 2018-11-17 DIAGNOSIS — Z90721 Acquired absence of ovaries, unilateral: Secondary | ICD-10-CM | POA: Diagnosis not present

## 2018-11-17 DIAGNOSIS — E669 Obesity, unspecified: Secondary | ICD-10-CM | POA: Insufficient documentation

## 2018-11-17 NOTE — Patient Instructions (Signed)
Dr Denman George has a low suspicion for cancer in the right ovary. She is recommending repeat ultrasound in 3 months with Dr Garwin Brothers. If that is stable, the next scan can be 1 year later.  If you feel very anxious about the ovary, Dr Denman George can remove it in a laparoscopic surgery. This would involve general anesthesia and surgical risks and a 1 to 3 week surgical recovery.  If you decide that removal of the ovary would best achieve your goals (regarding anxiety), please notify her office at [phone].

## 2018-11-17 NOTE — Progress Notes (Signed)
Consult Note: Gyn-Onc  Consult was requested by Dr. Garwin Brothers for the evaluation of Amanda Bender 78 y.o. female  CC:  Chief Complaint  Patient presents with  . Adnexal cyst    Assessment/Plan:  [name]  is a 78 y.o.  year old with stable cystic changes to the right ovary but with increased vascularity in the ovary.  She has a normal CA 125. The ovarian dimensions are of stable appearance for more than a decade.  I explained the subjective nature of vascular flow evaluations. While this is one potential concerning sign for malignancy, the majority of signs are stable and reassuring (including the natural history of a stable appearing ovary, the mostly cystic elements on examination, the normal tumor marker).  I discussed that the only diagnostic method would be surgery to remove the tube and ovary. I discussed that her obesity increases her risk for surgical complications, and surgery is typically associated with postoperative fatigue (already an issue for her ) and pain.  However I would be happy to perform this if this would provide Amanda Bender with reassurance moving forward.  Alternatively, I would recommend follow-up with a trans vaginal [place] in 3 months at Dr Garwin Brothers' office to re-evaluate the stability of findings in the ovary. If there has been no dramatic change at that time, I would recommend follow-up with [place] in 1 year.  After considering her options the patient is electing for close follow-up. If she changes her mind and elects for surgery, she will notify my office.   HPI: Ms Amanda Bender is a 78 year old P2 who is seen in consultation at the request of Dr Garwin Brothers for increased vascularity to a cystic right ovary.   The patient's history began in 2010 when she was followed for small ovarian cysts in the right ovary.  She has a remote history of a total abdominal hysterectomy and LSO for benign pathology.  She had seen my partner Dr. Fermin Schwab at that  time who felt that this is not a malignant process and recommended close follow-up.  She continued to have annual ultrasounds and Ca1 25 assessment with Dr. Garwin Brothers.   Ultrasound imaging had wise remained stable.  Her most recent evaluation took place on Nov 02, 2018.  The right ovary appeared within normal limits with calcification noted again that measured 0.4 mm.  The right adnexal cysts were again noted and were the same size with the largest measuring 1.5 x 1.1 cm.  There was slight vascularity noted between the cysts.  A previous ultrasound which had been performed on September 03, 2018 showed similar cystic areas with the largest measuring 1.6 cm and at that time also noted some vascularity in the area between the cysts.  There had been no change in architectural features over that 3 months time.  Including no increased vascularity.  There was no free fluid seen.  A Ca1 25 was drawn on November 08, 2018 which was normal at 28.  The patient's past medical history is significant for hypothyroidism, a total abdominal hysterectomy and LSO for uterine fibroids, plantar fasciitis, and a complex renal cyst which is being followed by an of urologist and is felt to be benign.  The patient's cancer history is family cancer history is remarkable for a strong history of prostate cancer including her paternal grandfather 2 paternal uncles and 2 paternal first cousins.  Her paternal great grandmother had uterine and ovarian cancer and the patient's brother has mesothelioma with a  history of asbestos exposure.  The patient is obese with a BMI of 34.  She has had 2 prior vaginal deliveries.  Her surgical history is significant for the hysterectomy as previously noted and abdominal surgery for an ectopic pregnancy 1976.   Current Meds:  Outpatient Encounter Medications as of 11/17/2018  Medication Sig  . albuterol (PROVENTIL HFA;VENTOLIN HFA) 108 (90 Base) MCG/ACT inhaler Inhale 2 puffs into the lungs every 4 (four)  hours as needed for wheezing or shortness of breath.  Marland Kitchen albuterol (PROVENTIL) (2.5 MG/3ML) 0.083% nebulizer solution Take 3 mLs (2.5 mg total) by nebulization every 6 (six) hours as needed for wheezing or shortness of breath.  . B Complex-C (B-COMPLEX WITH VITAMIN C) tablet Take 1 tablet by mouth daily.  . Carboxymeth-Glyc-Polysorb PF (REFRESH OPTIVE MEGA-3) 0.5-1-0.5 % SOLN Place 1 drop into both eyes 4 (four) times daily as needed (dry eyes).   . Cholecalciferol (VITAMIN D-3) 5000 units TABS Take 5,000 Units by mouth daily.  . famotidine (PEPCID) 40 MG tablet Take 40 mg by mouth 2 (two) times daily.  . fenofibrate 160 MG tablet Take 160 mg by mouth daily.  . Iron-FA-B Cmp-C-Biot-Probiotic (FUSION PLUS PO) Take 1 tablet by mouth daily.  Marland Kitchen levothyroxine (SYNTHROID) 25 MCG tablet Take 25 mcg by mouth daily before breakfast.  . magnesium gluconate (MAGONATE) 30 MG tablet Take 30 mg by mouth daily.  . metoprolol succinate (TOPROL-XL) 50 MG 24 hr tablet TAKE 1 TABLET BY MOUTH ONCE DAILY  . NP THYROID 60 MG tablet Take 60 mg by mouth daily before breakfast.   . OVER THE COUNTER MEDICATION Apply 1 application topically daily as needed (cramping). Magnesium topical cream - Magne Sport - uses for cramping  . OVER THE COUNTER MEDICATION Apply 1 application topically daily as needed (shoulder pain). Magne Hemp  . Respiratory Therapy Supplies (FLUTTER) DEVI Use as directed  . sodium chloride HYPERTONIC 3 % nebulizer solution Take by nebulization as needed for other.  . spironolactone (ALDACTONE) 50 MG tablet TAKE 1 TABLET BY MOUTH ONCE DAILY  . valACYclovir (VALTREX) 500 MG tablet Take 500 mg by mouth daily.  . [DISCONTINUED] thyroid (NP THYROID) 60 MG tablet Take 60 mg by mouth daily before breakfast. *NP THYROID*  . [DISCONTINUED] HYDROcodone-acetaminophen (NORCO) 5-325 MG tablet Take 1-2 tablets by mouth every 4 (four) hours as needed for moderate pain. (Patient not taking: Reported on 07/23/2018)  .  [DISCONTINUED] levothyroxine (SYNTHROID, LEVOTHROID) 25 MCG tablet Take 25 mcg by mouth daily before breakfast.    No facility-administered encounter medications on file as of 11/17/2018.     Allergy:  Allergies  Allergen Reactions  . Sulfa Antibiotics Hives  . Doxycycline     Urinary incontinence  . Iohexol      Code: HIVES, Desc: pt states hives w/IVP dye several yrs ago, pt premedicated w/46m benadryl 1 hr prior to scan per Dr.Stroud and pt was fine   . Sulfonamide Derivatives     Social Hx:   Social History   Socioeconomic History  . Marital status: Single    Spouse name: Not on file  . Number of children: Not on file  . Years of education: Not on file  . Highest education level: Not on file  Occupational History  . Not on file  Social Needs  . Financial resource strain: Not on file  . Food insecurity:    Worry: Not on file    Inability: Not on file  . Transportation needs:  Medical: Not on file    Non-medical: Not on file  Tobacco Use  . Smoking status: Former Smoker    Packs/day: 1.00    Years: 1.00    Pack years: 1.00    Types: Cigarettes    Last attempt to quit: 06/09/1961    Years since quitting: 57.4  . Smokeless tobacco: Never Used  . Tobacco comment: social smoker- never a habit.    Substance and Sexual Activity  . Alcohol use: No    Alcohol/week: 0.0 standard drinks  . Drug use: No  . Sexual activity: Not on file  Lifestyle  . Physical activity:    Days per week: Not on file    Minutes per session: Not on file  . Stress: Not on file  Relationships  . Social connections:    Talks on phone: Not on file    Gets together: Not on file    Attends religious service: Not on file    Active member of club or organization: Not on file    Attends meetings of clubs or organizations: Not on file    Relationship status: Not on file  . Intimate partner violence:    Fear of current or ex partner: Not on file    Emotionally abused: Not on file     Physically abused: Not on file    Forced sexual activity: Not on file  Other Topics Concern  . Not on file  Social History Narrative  . Not on file    Past Surgical Hx:  Past Surgical History:  Procedure Laterality Date  . ABDOMINAL HYSTERECTOMY    . APPENDECTOMY    . BUNIONECTOMY Bilateral   . COLONOSCOPY WITH ESOPHAGOGASTRODUODENOSCOPY (EGD)    . EYE SURGERY Left    cataract surgery with lens implant  . LAPAROSCOPY FOR ECTOPIC PREGNANCY    . SHOULDER ARTHROSCOPY WITH ROTATOR CUFF REPAIR AND SUBACROMIAL DECOMPRESSION Right 01/07/2018   Procedure: SHOULDER ARTHROSCOPY WITH ROTATOR CUFF REPAIR AND SUBACROMIAL DECOMPRESSION;  Surgeon: Tania Ade, MD;  Location: [facility];  Service: Orthopedics;  Laterality: Right;    Past Medical Hx:  Past Medical History:  Diagnosis Date  . Anemia    low iron  . Anxiety   . Arthritis   . Bronchiectasis ([place])   . Complex renal cyst   . Complication of anesthesia    slow to wake up  . Cramps, extremity   . Diverticulosis   . Dyspnea    with exertion  . Esophageal ulcer   . GERD (gastroesophageal reflux disease)   . Headache    migraines in the past - none since menopause  . Heart murmur   . History of hiatal hernia   . Hypertension   . Hyperthyroidism    as been treated (Graves disease) now hypothyroid  . Hypothyroidism   . Mitral prolapse   . Pneumonia   . PVC's (premature ventricular contractions)   . Thyroid disease     Past Gynecological History:  SVD x 2, no history of abnormal paps. S/p hysterectomy for fibroids. Hx of ectopic pregnancy. Hx of LSO at the time of hysterectomy. No LMP recorded. Patient has had a hysterectomy.  Family Hx:  Family History  Problem Relation Age of Onset  . Prostate cancer Maternal Grandfather   . Tuberculosis Mother   . Tuberculosis Brother   . Mesothelioma Brother   . Multiple myeloma Maternal Grandmother   . Pancreatic cancer Paternal Grandmother   . Prostate cancer Paternal Uncle   .  Prostate cancer Paternal Uncle   . Prostate cancer Cousin   . Prostate cancer Cousin     Review of Systems:  Constitutional  Feels well,    ENT Normal appearing ears and nares bilaterally Skin/Breast  No rash, sores, jaundice, itching, dryness Cardiovascular  No chest pain, shortness of breath, or edema  Pulmonary  No cough or wheeze.  Gastro Intestinal  No nausea, vomitting, or diarrhoea. No bright red blood per rectum, no abdominal pain, change in bowel movement, or constipation.  Genito Urinary  No frequency, urgency, dysuria,  Musculo Skeletal  No myalgia, arthralgia, joint swelling or pain  Neurologic  No weakness, numbness, change in gait,  Psychology  No depression, anxiety, insomnia.   Vitals:  Blood pressure (!) 148/66, pulse 70, temperature 98.3 F (36.8 C), temperature source Oral, resp. rate 17, height 5' 2.5" (1.588 m), weight 188 lb 9.6 oz (85.5 kg), SpO2 100 %.  Physical Exam: WD in NAD Neck  Supple NROM, without any enlargements.  Lymph Node Survey No cervical supraclavicular or inguinal adenopathy Cardiovascular  Pulse normal rate, regularity and rhythm. S1 and S2 normal.  Lungs  Clear to auscultation bilateraly, without wheezes/crackles/rhonchi. Good air movement.  Skin  No rash/lesions/breakdown  Psychiatry  Alert and oriented to person, place, and time  Abdomen  Normoactive bowel sounds, abdomen soft, non-tender and obese without evidence of hernia.  Back No CVA tenderness Genito Urinary  Vulva/vagina: Normal external female genitalia.  No lesions. No discharge or bleeding.  Bladder/urethra:  No lesions or masses, well supported bladder  Vagina: small nodule in the right paravaginal tissues  Cervix and uterus surgically absent  Adnexa: no palpable masses. Rectal  deferred Extremities  No bilateral cyanosis, clubbing or edema.   Thereasa Solo, MD  11/17/2018, 12:52 PM

## 2019-01-25 ENCOUNTER — Telehealth: Payer: Self-pay | Admitting: Oncology

## 2019-01-25 NOTE — Telephone Encounter (Signed)
Called Amanda Bender regarding scheduling an ultrasound with Dr. Garwin Brothers for September.  She said she wants to be sure that Dr. Harvie Bridge office is aware of what Dr. Denman George has recommended for her.  Called Dr. Harvie Bridge office and they are aware that Amanda Bender needs to be scheduled for an ultrasound in September.  Called Mike back and let her know.  She will call this afternoon to schedule the appointment.

## 2019-04-12 ENCOUNTER — Encounter: Payer: Self-pay | Admitting: Internal Medicine

## 2019-04-12 ENCOUNTER — Ambulatory Visit (INDEPENDENT_AMBULATORY_CARE_PROVIDER_SITE_OTHER): Payer: Medicare Other | Admitting: Internal Medicine

## 2019-04-12 ENCOUNTER — Other Ambulatory Visit: Payer: Self-pay

## 2019-04-12 VITALS — BP 120/81 | HR 83 | Ht 62.5 in | Wt 181.0 lb

## 2019-04-12 DIAGNOSIS — I10 Essential (primary) hypertension: Secondary | ICD-10-CM

## 2019-04-12 DIAGNOSIS — R600 Localized edema: Secondary | ICD-10-CM | POA: Diagnosis not present

## 2019-04-12 DIAGNOSIS — I059 Rheumatic mitral valve disease, unspecified: Secondary | ICD-10-CM | POA: Diagnosis not present

## 2019-04-12 NOTE — Progress Notes (Signed)
OFFICE NOTE  Chief Complaint:  Routine follow-up  Primary Care Physician: Willey Blade, MD  HPI:  Amanda Bender is a pleasant 78 year old female with a number of medical problems. 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 Apparently she also has a history of murmur since she was a child and was once diagnosed with mitral valve prolapse in the past the. Her family history is significant mostly for cancer and some heart disease in her father. She was referred for evaluation of lower extremity swelling earlier in the year, for which he says is been occurring over the past several months to a year. The swelling worsens when she is on her feet and improves after elevating them at night. There is no history of varicose veins in the family and she has never been diagnosed with in the past. She does have some neuropathic type pain including pain in both heels, which is yet to have evaluated. She says is painful when she walks especially bearing weight on her heels. She is on a medications that are typically associated with lower extremity swelling. She is also not on a diuretic. Imaging was performed which was negative for any venous insufficiency, and she was diagnosed with lymphedema.   She was now referred back for evaluation of palpitations. She reports a recently she's been having an increase in fatigue, shortness of breath, especially when walking up stairs and associated palpitations. At times she feels like her heart races. She's tried coughing and bearing down to stop this but it has no effect on it. The episodes occur almost on a daily basis. She had previously seen Dr. Einar Gip and had worn a monitor for some period of time which apparently was unrevealing. She also reports chronic fatigue symptoms as well as myofascial pain which occurs in her bilateral upper arms and upper trapezius and lower  cervical spine areas as well as her legs. She reports episodes of paroxysmal pain, shortness of breath fatigue and rapid heart rate which could be signs of fibromyalgia.  Finally she reports palpitations that have improved with self treatment. She increased her metoprolol tartrate from once daily 50 mg to 50 mg in the morning and 25 mg at night. Then she started to increase it to 50 mg twice daily. She is currently taking this dose, but reports that in the early afternoon or early morning that her palpitations do "breakthrough".  Amanda Bender returns today for followup of her echocardiogram and monitor. The echocardiogram did demonstrate mild mitral regurgitation, grade 1 diastolic dysfunction and an EF of 65-70%. No wall motion abnormalities. Her monitor did demonstrate PVCs which were isolated and seem to come at various times throughout the day. There was no significant nonsustained ventricular tachycardia or other worrisome rhythms. She continues to complain of palpitations and shortness of breath with activities that previously did not bother her. Her course has been more weight gain recently as well. She continues to deny any chest pain.  Amanda Bender did undergo a metabolic test. This demonstrated a less than optimal effort with a decreased VO2 of 74%.  Peak heart rate was 68% predicted, and there was flattening of the heart rate and VO2 curves. There is a sharp rise in VO2 during free pedaling suggesting that her obesity is contributing to some of her shortness of breath. In addition, the flattening of her heart rate her suggest an element of medication related chronotropic  incompetence.  Unfortunately, she feels that she needs higher doses of beta blocker to suppress her PVCs. In fact she reports that her palpitations are improved on higher dose beta blocker and that it is actually improving her shortness of breath.  On followup today [name] has no new complaints. Her shortness of breath  is stable. Her leg swelling is still present but perhaps slightly better. She did not have a significant palpitations except when she does not get good rest.  I saw [name] back today in the office. She actually has numerous complaints. Her last exam was a couple of years ago. Today she is presenting with palpitations, worsening constant shortness of breath with minimal exertion, chest pressure, leg swelling, and has reported left temporal headache which is intermittent and not associated with vision changes. Her shortness of breath tends to be with minimal exertion and has been progressive over the past couple of months. Prior to this she had an upper respiratory infection and feels like she "has not completely gotten over it".  10/08/2015  Amanda Bender returns today for follow-up. She reports some mild improvement in shortness of breath. Her weight is down 5 pounds with the increase in Aldactone to 50 mg daily. She says her swelling is resolved. Her BNP was low at 80. Renal function is stable and potassium is normal. She continues to have some shortness of breath however and cough. In the past she had seen Dr. Lanny Hurst clamps. I advised her that he is no longer with with our pulmonary, but she would like a referral back to pulmonary for reevaluation. Blood pressure appears well controlled today. Her nuclear stress test was low risk with an EF of 80% and no evidence of ischemia. She still gets occasional temporal headaches which may be cluster headaches. Her sedimentation rate was low, not being suggestive of temporal arteritis.  05/07/2016  Amanda Bender returns for follow-up. She has seen Dr. Lake Bells with pulmonary in follow-up and he diagnosed her with bronchiectasis. 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 She was treated with antibiotics which she says helped her breathing, but now she is bothered by frequent incontinence. She denies any chest pain.  05/08/2017  Amanda Bender was  seen today in follow-up.  She reports of occasional shortness of breath which has been stable.  She has a history of bronchiectasis.  She denies any chest pain.  Blood pressure is well controlled 120/60.  She occasionally gets some leg swelling.  She said she had had some leg swelling last night but it improved in the morning.  She may have increased her salt intake recently inadvertently.  EKG shows sinus rhythm at 62 without ischemic changes.  04/20/2018  Amanda Bender returns today for annual follow-up.  Over the past year she is had some fatigue, particularly within the last few days.  Blood pressure was noted to be low today at 94/58.  Is not clear whether this is a trend or just an outlier today.  She denies any chest pain.  She does get some shortness of breath only with moderate exertion.  She is on 3 different blood pressure medications.  She is also noted some improvement in her swelling which is possibly attributable to spironolactone.  04/12/2019  Amanda Bender is seen today in follow-up.  Overall she seems to be doing well.  She recently had some issues with iatrogenic hyperthyroidism.  Her medications were readjusted.  She was also in the ER for acute renal  failure.  This was thought to be due to her angiotensin receptor blocker which was discontinued.  She was also off of Aldactone for a while and her creatinine had recovered back to 1.2 (it was greater than 2.0).  She is now pretty much on monotherapy Aldactone and Toprol.  She denies any palpitations.  PMHx:  Past Medical History:  Diagnosis Date  . Anemia    low iron  . Anxiety   . Arthritis   . Bronchiectasis ([place])   . Complex renal cyst   . Complication of anesthesia    slow to wake up  . Cramps, extremity   . Diverticulosis   . Dyspnea    with exertion  . Esophageal ulcer   . GERD (gastroesophageal reflux disease)   . Headache    migraines in the past - none since menopause  . Heart murmur   . History of hiatal  hernia   . Hypertension   . Hyperthyroidism    as been treated (Graves disease) now hypothyroid  . Hypothyroidism   . Mitral prolapse   . Pneumonia   . PVC's (premature ventricular contractions)   . Thyroid disease     Past Surgical History:  Procedure Laterality Date  . ABDOMINAL HYSTERECTOMY    . APPENDECTOMY    . BUNIONECTOMY Bilateral   . COLONOSCOPY WITH ESOPHAGOGASTRODUODENOSCOPY (EGD)    . EYE SURGERY Left    cataract surgery with lens implant  . LAPAROSCOPY FOR ECTOPIC PREGNANCY    . SHOULDER ARTHROSCOPY WITH ROTATOR CUFF REPAIR AND SUBACROMIAL DECOMPRESSION Right 01/07/2018   Procedure: SHOULDER ARTHROSCOPY WITH ROTATOR CUFF REPAIR AND SUBACROMIAL DECOMPRESSION;  Surgeon: Tania Ade, MD;  Location: [facility];  Service: Orthopedics;  Laterality: Right;    FAMHx:  Family History  Problem Relation Age of Onset  . Prostate cancer Maternal Grandfather   . Tuberculosis Mother   . Tuberculosis Brother   . Mesothelioma Brother   . Multiple myeloma Maternal Grandmother   . Pancreatic cancer Paternal Grandmother   . Prostate cancer Paternal Uncle   . Prostate cancer Paternal Uncle   . Prostate cancer Cousin   . Prostate cancer Cousin     SOCHx:   reports that she quit smoking about 57 years ago. Her smoking use included cigarettes. She has a 1.00 pack-year smoking history. She has never used smokeless tobacco. She reports that she does not drink alcohol or use drugs.  ALLERGIES:  Allergies  Allergen Reactions  . Sulfa Antibiotics Hives  . Doxycycline     Urinary incontinence  . Iohexol      Code: HIVES, Desc: pt states hives w/IVP dye several yrs ago, pt premedicated w/73m benadryl 1 hr prior to scan per Dr.Stroud and pt was fine   . Sulfonamide Derivatives     ROS: Pertinent items noted in HPI and remainder of comprehensive ROS otherwise negative.  HOME MEDS: Current Outpatient Medications  Medication Sig Dispense Refill  . albuterol (PROVENTIL  HFA;VENTOLIN HFA) 108 (90 Base) MCG/ACT inhaler Inhale 2 puffs into the lungs every 4 (four) hours as needed for wheezing or shortness of breath.    .Marland Kitchenalbuterol (PROVENTIL) (2.5 MG/3ML) 0.083% nebulizer solution Take 3 mLs (2.5 mg total) by nebulization every 6 (six) hours as needed for wheezing or shortness of breath. 360 mL 11  . B Complex-C (B-COMPLEX WITH VITAMIN C) tablet Take 1 tablet by mouth daily.    . Carboxymeth-Glyc-Polysorb PF (REFRESH OPTIVE MEGA-3) 0.5-1-0.5 % SOLN Place 1 drop into both eyes  4 (four) times daily as needed (dry eyes).     . Cholecalciferol (VITAMIN D-3) 5000 units TABS Take 5,000 Units by mouth daily.    . famotidine (PEPCID) 40 MG tablet Take 40 mg by mouth 2 (two) times daily.    . fenofibrate 160 MG tablet Take 160 mg by mouth daily.    . Iron-FA-B Cmp-C-Biot-Probiotic (FUSION PLUS PO) Take 1 tablet by mouth daily.    Marland Kitchen levothyroxine (SYNTHROID) 25 MCG tablet Take 25 mcg by mouth daily before breakfast.    . magnesium gluconate (MAGONATE) 30 MG tablet Take 30 mg by mouth daily.    . metoprolol succinate (TOPROL-XL) 50 MG 24 hr tablet TAKE 1 TABLET BY MOUTH ONCE DAILY 90 tablet 2  . NP THYROID 60 MG tablet Take 60 mg by mouth daily before breakfast.     . OVER THE COUNTER MEDICATION Apply 1 application topically daily as needed (cramping). Magnesium topical cream - Magne Sport - uses for cramping    . OVER THE COUNTER MEDICATION Apply 1 application topically daily as needed (shoulder pain). Magne Hemp    . Respiratory Therapy Supplies (FLUTTER) DEVI Use as directed 1 each 0  . sodium chloride HYPERTONIC 3 % nebulizer solution Take by nebulization as needed for other. 750 mL 11  . spironolactone (ALDACTONE) 50 MG tablet TAKE 1 TABLET BY MOUTH ONCE DAILY 90 tablet 3  . valACYclovir (VALTREX) 500 MG tablet Take 500 mg by mouth daily.     No current facility-administered medications for this visit.     LABS/IMAGING: No results found for this or any previous visit  (from the past 48 hour(s)). No results found.  VITALS: BP 120/81   Pulse 83   Ht 5' 2.5" (1.588 m)   Wt 181 lb (82.1 kg)   SpO2 100%   BMI 32.58 kg/m   EXAM: General appearance: alert and no distress Neck: no carotid bruit, no JVD and thyroid not enlarged, symmetric, no tenderness/mass/nodules Lungs: clear to auscultation bilaterally Heart: regular rate and rhythm, S1, S2 normal and systolic murmur: early systolic 2/6, blowing at apex Abdomen: soft, non-tender; bowel sounds normal; no masses,  no organomegaly Extremities: extremities normal, atraumatic, no cyanosis or edema Pulses: 2+ and symmetric Skin: Skin color, texture, turgor normal. No rashes or lesions Neurologic: Grossly normal Psych: Pleasant   EKG: Normal sinus rhythm 83, incomplete right bundle branch block-personally reviewed  ASSESSMENT: 1. DOE, related to bronchiectasis - prior TB exposure - normal nuclear stress test with EF 80% 2. Murmur - no significant echo findings (2017, normal LVEF 65-70%, grade 1 DD) 3. Frequent palpitaitons 4. Fatigue/chronic myofascial pain 5. Left temporal headache 6. Leg edema  PLAN: 1.   Amanda Bender had recent acute kidney injury which resolved after discontinuing ARB.  Her blood pressure is now well controlled primarily on monotherapy Aldactone with some beta-blocker.  Her palpitations are well controlled.  She denies any chest pain.  She had some leg edema which is also improved on the Aldactone.  Recent labs showed LDL-P of just over 1100 however she was hyperthyroid.  This will need to be readjusted and reassessed by her primary provider.  Follow-up with me annually or sooner as necessary.  Pixie Casino, MD, [facility], [place] Director of the Advanced Lipid [facility] &  Cardiovascular [facility] Attending Cardiologist  Direct Dial: [phone]  Fax: [phone]  Website:  [email or web address] 04/12/2019,  1:53 PM

## 2019-04-12 NOTE — Patient Instructions (Signed)
Medication Instructions:  Your physician recommends that you continue on your current medications as directed. Please refer to the Current Medication list given to you today.  *If you need a refill on your cardiac medications before your next appointment, please call your pharmacy*  Follow-Up: At [facility], you and your health needs are our priority.  As part of our continuing mission to provide you with exceptional heart care, we have created designated Provider Care Teams.  These Care Teams include your primary Cardiologist (physician) and Advanced Practice Providers (APPs -  Physician Assistants and Nurse Practitioners) who all work together to provide you with the care you need, when you need it.  Your next appointment:   12 months  The format for your next appointment:   Either In Person or Virtual  Provider:   Raliegh Ip Mali Hilty, MD  Other Instructions

## 2019-04-14 ENCOUNTER — Other Ambulatory Visit: Payer: Self-pay

## 2019-04-14 MED ORDER — METOPROLOL SUCCINATE ER 50 MG PO TB24: 50.0000 mg | ORAL_TABLET | Freq: Every day | ORAL | 2 refills | Status: DC

## 2019-07-08 ENCOUNTER — Other Ambulatory Visit: Payer: Self-pay | Admitting: Internal Medicine

## 2019-07-08 DIAGNOSIS — I10 Essential (primary) hypertension: Secondary | ICD-10-CM

## 2020-02-02 ENCOUNTER — Other Ambulatory Visit: Payer: Self-pay | Admitting: Internal Medicine

## 2020-02-02 NOTE — Telephone Encounter (Signed)
Rx has been sent to the pharmacy electronically. ° °

## 2020-03-24 ENCOUNTER — Ambulatory Visit: Payer: Medicare Other

## 2020-04-07 ENCOUNTER — Ambulatory Visit: Payer: Medicare PPO | Attending: Internal Medicine

## 2020-04-07 ENCOUNTER — Ambulatory Visit: Payer: Medicare PPO

## 2020-04-07 DIAGNOSIS — Z23 Encounter for immunization: Secondary | ICD-10-CM

## 2020-04-07 NOTE — Progress Notes (Signed)
   Covid-19 Vaccination Clinic  Name:  KARIANNA GUSMAN    MRN: [id number] DOB: 1940-07-06  04/07/2020  Ms. Ruppe was observed post Covid-19 immunization for 15 minutes without incident. She was provided with Vaccine Information Sheet and instruction to access the V-Safe system.   Ms. Wareing was instructed to call 911 with any severe reactions post vaccine: Marland Kitchen Difficulty breathing  . Swelling of face and throat  . A fast heartbeat  . A bad rash all over body  . Dizziness and weakness

## 2020-05-02 ENCOUNTER — Other Ambulatory Visit: Payer: Self-pay | Admitting: Internal Medicine

## 2020-07-21 ENCOUNTER — Other Ambulatory Visit: Payer: Self-pay | Admitting: Internal Medicine

## 2020-07-21 DIAGNOSIS — I10 Essential (primary) hypertension: Secondary | ICD-10-CM

## 2020-07-31 ENCOUNTER — Other Ambulatory Visit: Payer: Self-pay | Admitting: Internal Medicine

## 2020-08-23 ENCOUNTER — Other Ambulatory Visit: Payer: Self-pay

## 2020-08-23 ENCOUNTER — Encounter: Payer: Self-pay | Admitting: Internal Medicine

## 2020-08-23 ENCOUNTER — Ambulatory Visit: Payer: Medicare PPO | Admitting: Internal Medicine

## 2020-08-23 VITALS — BP 121/72 | HR 67 | Ht 63.0 in | Wt 190.0 lb

## 2020-08-23 DIAGNOSIS — I059 Rheumatic mitral valve disease, unspecified: Secondary | ICD-10-CM

## 2020-08-23 DIAGNOSIS — I10 Essential (primary) hypertension: Secondary | ICD-10-CM | POA: Diagnosis not present

## 2020-08-23 DIAGNOSIS — R0602 Shortness of breath: Secondary | ICD-10-CM | POA: Diagnosis not present

## 2020-08-23 NOTE — Patient Instructions (Signed)
Medication Instructions:  Your physician recommends that you continue on your current medications as directed. Please refer to the Current Medication list given to you today.  *If you need a refill on your cardiac medications before your next appointment, please call your pharmacy*   Testing/Procedures: Dr. Debara Pickett has ordered a cardiopulmonary exercise test. This is done at [facility]: At [facility], you and your health needs are our priority.  As part of our continuing mission to provide you with exceptional heart care, we have created designated Provider Care Teams.  These Care Teams include your primary Cardiologist (physician) and Advanced Practice Providers (APPs -  Physician Assistants and Nurse Practitioners) who all work together to provide you with the care you need, when you need it.  We recommend signing up for the patient portal called "MyChart".  Sign up information is provided on this After Visit Summary.  MyChart is used to connect with patients for Virtual Visits (Telemedicine).  Patients are able to view lab/test results, encounter notes, upcoming appointments, etc.  Non-urgent messages can be sent to your provider as well.   To learn more about what you can do with MyChart, go to [email or web address].    Your next appointment:   2 month(s)  The format for your next appointment:   In Person  Provider:   You may see Pixie Casino, MD or one of the following Advanced Practice Providers on your designated Care Team:    Almyra Deforest, PA-C  Fabian Sharp, PA-C or   Roby Lofts, [place]    Other Instructions

## 2020-08-23 NOTE — Progress Notes (Signed)
OFFICE NOTE  Chief Complaint:  Routine follow-up  Primary Care Physician: Willey Blade, MD  HPI:  Amanda Bender is a pleasant 80 year old female with a number of medical problems. 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 Apparently she also has a history of murmur since she was a child and was once diagnosed with mitral valve prolapse in the past the. Her family history is significant mostly for cancer and some heart disease in her father. She was referred for evaluation of lower extremity swelling earlier in the year, for which he says is been occurring over the past several months to a year. The swelling worsens when she is on her feet and improves after elevating them at night. There is no history of varicose veins in the family and she has never been diagnosed with in the past. She does have some neuropathic type pain including pain in both heels, which is yet to have evaluated. She says is painful when she walks especially bearing weight on her heels. She is on a medications that are typically associated with lower extremity swelling. She is also not on a diuretic. Imaging was performed which was negative for any venous insufficiency, and she was diagnosed with lymphedema.   She was now referred back for evaluation of palpitations. She reports a recently she's been having an increase in fatigue, shortness of breath, especially when walking up stairs and associated palpitations. At times she feels like her heart races. She's tried coughing and bearing down to stop this but it has no effect on it. The episodes occur almost on a daily basis. She had previously seen Amanda Bender and had worn a monitor for some period of time which apparently was unrevealing. She also reports chronic fatigue symptoms as well as myofascial pain which occurs in her bilateral upper arms and upper trapezius and lower  cervical spine areas as well as her legs. She reports episodes of paroxysmal pain, shortness of breath fatigue and rapid heart rate which could be signs of fibromyalgia.  Finally she reports palpitations that have improved with self treatment. She increased her metoprolol tartrate from once daily 50 mg to 50 mg in the morning and 25 mg at night. Then she started to increase it to 50 mg twice daily. She is currently taking this dose, but reports that in the early afternoon or early morning that her palpitations do "breakthrough".  Amanda Bender returns today for followup of her echocardiogram and monitor. The echocardiogram did demonstrate mild mitral regurgitation, grade 1 diastolic dysfunction and an EF of 65-70%. No wall motion abnormalities. Her monitor did demonstrate PVCs which were isolated and seem to come at various times throughout the day. There was no significant nonsustained ventricular tachycardia or other worrisome rhythms. She continues to complain of palpitations and shortness of breath with activities that previously did not bother her. Her course has been more weight gain recently as well. She continues to deny any chest pain.  Amanda Bender did undergo a metabolic test. This demonstrated a less than optimal effort with a decreased VO2 of 74%.  Peak heart rate was 68% predicted, and there was flattening of the heart rate and VO2 curves. There is a sharp rise in VO2 during free pedaling suggesting that her obesity is contributing to some of her shortness of breath. In addition, the flattening of her heart rate her suggest an element of medication related chronotropic  incompetence.  Unfortunately, she feels that she needs higher doses of beta blocker to suppress her PVCs. In fact she reports that her palpitations are improved on higher dose beta blocker and that it is actually improving her shortness of breath.  On followup today Amanda Bender has no new complaints. Her shortness of breath  is stable. Her leg swelling is still present but perhaps slightly better. She did not have a significant palpitations except when she does not get good rest.  I saw Amanda Bender back today in the office. She actually has numerous complaints. Her last exam was a couple of years ago. Today she is presenting with palpitations, worsening constant shortness of breath with minimal exertion, chest pressure, leg swelling, and has reported left temporal headache which is intermittent and not associated with vision changes. Her shortness of breath tends to be with minimal exertion and has been progressive over the past couple of months. Prior to this she had an upper respiratory infection and feels like she "has not completely gotten over it".  10/08/2015  Amanda Bender returns today for follow-up. She reports some mild improvement in shortness of breath. Her weight is down 5 pounds with the increase in Aldactone to 50 mg daily. She says her swelling is resolved. Her BNP was low at 80. Renal function is stable and potassium is normal. She continues to have some shortness of breath however and cough. In the past she had seen Amanda Bender. I advised her that he is no longer with with our pulmonary, but she would like a referral back to pulmonary for reevaluation. Blood pressure appears well controlled today. Her nuclear stress test was low risk with an EF of 80% and no evidence of ischemia. She still gets occasional temporal headaches which may be cluster headaches. Her sedimentation rate was low, not being suggestive of temporal arteritis.  05/07/2016  Amanda Bender returns for follow-up. She has seen Amanda Bender with pulmonary in follow-up and he diagnosed her with bronchiectasis. 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 She was treated with antibiotics which she says helped her breathing, but now she is bothered by frequent incontinence. She denies any chest pain.  05/08/2017  Amanda Bender was  seen today in follow-up.  She reports of occasional shortness of breath which has been stable.  She has a history of bronchiectasis.  She denies any chest pain.  Blood pressure is well controlled 120/60.  She occasionally gets some leg swelling.  She said she had had some leg swelling last night but it improved in the morning.  She may have increased her salt intake recently inadvertently.  EKG shows sinus rhythm at 62 without ischemic changes.  04/20/2018  Amanda Bender returns today for annual follow-up.  Over the past year she is had some fatigue, particularly within the last few days.  Blood pressure was noted to be low today at 94/58.  Is not clear whether this is a trend or just an outlier today.  She denies any chest pain.  She does get some shortness of breath only with moderate exertion.  She is on 3 different blood pressure medications.  She is also noted some improvement in her swelling which is possibly attributable to spironolactone.  04/12/2019  Amanda Bender is seen today in follow-up.  Overall she seems to be doing well.  She recently had some issues with iatrogenic hyperthyroidism.  Her medications were readjusted.  She was also in the ER for acute renal  failure.  This was thought to be due to her angiotensin receptor blocker which was discontinued.  She was also off of Aldactone for a while and her creatinine had recovered back to 1.2 (it was greater than 2.0).  She is now pretty much on monotherapy Aldactone and Toprol.  She denies any palpitations.  08/23/2020  Amanda Bender is seen today in follow-up.  Overall she seems to be doing well.  She had some issues with worsening renal function requiring a discontinuation of her diuretics except for the Aldactone which she stays on at 50 mg daily.  This is mentioned above.  EKG today shows normal sinus rhythm with a right bundle branch block.  She denies any chest pain, but does seem to have persistent shortness of breath with exertion.   There has been some weight gain.  Is not clear whether this might be related to that or not.  She also has a history of bronchiectasis with prior TB exposure.  PMHx:  Past Medical History:  Diagnosis Date  . Anemia    low iron  . Anxiety   . Arthritis   . Bronchiectasis ([place])   . Complex renal cyst   . Complication of anesthesia    slow to wake up  . Cramps, extremity   . Diverticulosis   . Dyspnea    with exertion  . Esophageal ulcer   . GERD (gastroesophageal reflux disease)   . Headache    migraines in the past - none since menopause  . Heart murmur   . History of hiatal hernia   . Hypertension   . Hyperthyroidism    as been treated (Graves disease) now hypothyroid  . Hypothyroidism   . Mitral prolapse   . Pneumonia   . PVC's (premature ventricular contractions)   . Thyroid disease     Past Surgical History:  Procedure Laterality Date  . ABDOMINAL HYSTERECTOMY    . APPENDECTOMY    . BUNIONECTOMY Bilateral   . COLONOSCOPY WITH ESOPHAGOGASTRODUODENOSCOPY (EGD)    . EYE SURGERY Left    cataract surgery with lens implant  . LAPAROSCOPY FOR ECTOPIC PREGNANCY    . SHOULDER ARTHROSCOPY WITH ROTATOR CUFF REPAIR AND SUBACROMIAL DECOMPRESSION Right 01/07/2018   Procedure: SHOULDER ARTHROSCOPY WITH ROTATOR CUFF REPAIR AND SUBACROMIAL DECOMPRESSION;  Surgeon: Tania Ade, MD;  Location: [facility];  Service: Orthopedics;  Laterality: Right;    FAMHx:  Family History  Problem Relation Age of Onset  . Prostate cancer Maternal Grandfather   . Tuberculosis Mother   . Tuberculosis Brother   . Mesothelioma Brother   . Multiple myeloma Maternal Grandmother   . Pancreatic cancer Paternal Grandmother   . Prostate cancer Paternal Uncle   . Prostate cancer Paternal Uncle   . Prostate cancer Cousin   . Prostate cancer Cousin     SOCHx:   reports that she quit smoking about 59 years ago. Her smoking use included cigarettes. She has a 1.00 pack-year smoking history. She has never  used smokeless tobacco. She reports that she does not drink alcohol and does not use drugs.  ALLERGIES:  Allergies  Allergen Reactions  . Sulfa Antibiotics Hives  . Doxycycline     Urinary incontinence  . Iohexol      Code: HIVES, Desc: pt states hives w/IVP dye several yrs ago, pt premedicated w/$RemoveBeforeDEI'50mg'[id number]$  benadryl 1 hr prior to scan per AmandaStroud and pt was fine   . Sulfonamide Derivatives     ROS: Pertinent items noted in HPI  and remainder of comprehensive ROS otherwise negative.  HOME MEDS: Current Outpatient Medications  Medication Sig Dispense Refill  . albuterol (PROVENTIL HFA;VENTOLIN HFA) 108 (90 Base) MCG/ACT inhaler Inhale 2 puffs into the lungs every 4 (four) hours as needed for wheezing or shortness of breath.    . B Complex-C (B-COMPLEX WITH VITAMIN C) tablet Take 1 tablet by mouth daily.    . Cholecalciferol (VITAMIN D-3) 5000 units TABS Take 5,000 Units by mouth daily.    . cycloSPORINE (RESTASIS) 0.05 % ophthalmic emulsion Restasis 0.05 % eye drops in a dropperette    . famotidine (PEPCID) 40 MG tablet Take 40 mg by mouth 2 (two) times daily.    . fenofibrate 160 MG tablet Take 160 mg by mouth daily.    Marland Kitchen levothyroxine (SYNTHROID) 25 MCG tablet Take 25 mcg by mouth daily before breakfast.    . magnesium gluconate (MAGONATE) 30 MG tablet Take 30 mg by mouth daily.    . metoprolol succinate (TOPROL-XL) 50 MG 24 hr tablet TAKE 1 TABLET BY MOUTH ONCE DAILY AFTER A MEAL 90 tablet 0  . NP THYROID 60 MG tablet Take 30 mg by mouth daily before breakfast.    . OVER THE COUNTER MEDICATION Apply 1 application topically daily as needed (cramping). Magnesium topical cream - Magne Sport - uses for cramping    . Respiratory Therapy Supplies (FLUTTER) DEVI Use as directed 1 each 0  . sodium chloride HYPERTONIC 3 % nebulizer solution Take by nebulization as needed for other. 750 mL 11  . spironolactone (ALDACTONE) 50 MG tablet TAKE 1 TABLET(50 MG) BY MOUTH DAILY 90 tablet 1  .  valACYclovir (VALTREX) 500 MG tablet Take 500 mg by mouth daily.    . Iron-FA-B Cmp-C-Biot-Probiotic (FUSION PLUS PO) Take 1 tablet by mouth daily. (Patient not taking: Reported on 08/23/2020)     No current facility-administered medications for this visit.    LABS/IMAGING: No results found for this or any previous visit (from the past 48 hour(s)). No results found.  VITALS: BP 121/72   Pulse 67   Ht 5\' 3"  (1.6 m)   Wt 190 lb (86.2 kg)   SpO2 100%   BMI 33.66 kg/m   EXAM: General appearance: alert and no distress Neck: no carotid bruit, no JVD and thyroid not enlarged, symmetric, no tenderness/mass/nodules Lungs: clear to auscultation bilaterally Heart: regular rate and rhythm, S1, S2 normal and systolic murmur: early systolic 2/6, blowing at apex Abdomen: soft, non-tender; bowel sounds normal; no masses,  no organomegaly Extremities: extremities normal, atraumatic, no cyanosis or edema Pulses: 2+ and symmetric Skin: Skin color, texture, turgor normal. No rashes or lesions Neurologic: Grossly normal Psych: Pleasant   EKG: Sinus rhythm at 66, RBBB-personally reviewed  ASSESSMENT: 1. DOE, related to bronchiectasis - prior TB exposure - normal nuclear stress test with EF 80% 2. Murmur - no significant echo findings (2017, normal LVEF 65-70%, grade 1 DD) 3. Frequent palpitaitons 4. Fatigue/chronic myofascial pain 5. Left temporal headache 6. Leg edema  PLAN: 1.   Ms. Delgreco continues to have symptoms of shortness of breath which is possibly worsening.  She denies any angina.  I suspect this could be related to weight and decreased activity.  Would recommend cardio metabolic testing to determine VO2 utilization.  Plan follow-up with me afterwards.  Mitzi Davenport, MD, [facility], FACP  [facility]  Medical Director of the [facility]  [facility] Attending Cardiologist  Direct Dial:  [phone]  Fax: [phone]   Website:  [email or web address] 08/23/2020, 10:45 AM

## 2020-09-17 ENCOUNTER — Encounter (HOSPITAL_COMMUNITY): Payer: Medicare PPO

## 2020-10-16 ENCOUNTER — Ambulatory Visit: Payer: Medicare PPO | Admitting: Physician Assistant

## 2020-10-22 ENCOUNTER — Encounter (HOSPITAL_COMMUNITY): Payer: Medicare PPO

## 2020-10-30 ENCOUNTER — Ambulatory Visit: Payer: Medicare PPO | Admitting: Physician Assistant

## 2020-11-10 ENCOUNTER — Other Ambulatory Visit: Payer: Self-pay | Admitting: Internal Medicine

## 2020-12-20 ENCOUNTER — Ambulatory Visit (HOSPITAL_COMMUNITY): Payer: Medicare PPO | Attending: Internal Medicine

## 2020-12-20 ENCOUNTER — Other Ambulatory Visit: Payer: Self-pay

## 2020-12-20 DIAGNOSIS — R0602 Shortness of breath: Secondary | ICD-10-CM

## 2021-01-06 NOTE — Progress Notes (Signed)
Cardiology Clinic Note   Patient Name: Amanda Bender Date of Encounter: 01/07/2021  Primary Care Provider:  Willey Blade, MD Primary Cardiologist:  Pixie Casino, MD  Patient Profile    Amanda Bender 80 year old female presents the clinic today for follow-up evaluation of her shortness of breath, essential hypertension, and mitral valve disorder.  Past Medical History    Past Medical History:  Diagnosis Date   Anemia    low iron   Anxiety    Arthritis    Bronchiectasis (HCC)    Complex renal cyst    Complication of anesthesia    slow to wake up   Cramps, extremity    Diverticulosis    Dyspnea    with exertion   Esophageal ulcer    GERD (gastroesophageal reflux disease)    Headache    migraines in the past - none since menopause   Heart murmur    History of hiatal hernia    Hypertension    Hyperthyroidism    as been treated (Graves disease) now hypothyroid   Hypothyroidism    Mitral prolapse    Pneumonia    PVC's (premature ventricular contractions)    Thyroid disease    Past Surgical History:  Procedure Laterality Date   ABDOMINAL HYSTERECTOMY     APPENDECTOMY     BUNIONECTOMY Bilateral    COLONOSCOPY WITH ESOPHAGOGASTRODUODENOSCOPY (EGD)     EYE SURGERY Left    cataract surgery with lens implant   LAPAROSCOPY FOR ECTOPIC PREGNANCY     SHOULDER ARTHROSCOPY WITH ROTATOR CUFF REPAIR AND SUBACROMIAL DECOMPRESSION Right 01/07/2018   Procedure: SHOULDER ARTHROSCOPY WITH ROTATOR CUFF REPAIR AND SUBACROMIAL DECOMPRESSION;  Surgeon: Tania Ade, MD;  Location: [facility];  Service: Orthopedics;  Laterality: Right;    Allergies  Allergies  Allergen Reactions   Sulfa Antibiotics Hives   Cephalexin Rash   Contrast Media [Iodinated Diagnostic Agents]    Doxycycline     Urinary incontinence   Iohexol      Code: HIVES, Desc: pt states hives w/IVP dye several yrs ago, pt premedicated w/35m benadryl 1 hr prior to scan per Dr.Stroud and pt was  fine    Sulfonamide Derivatives     History of Present Illness    Amanda Bender has a PMH of essential hypertension, mitral valve disorder, and shortness of breath.  She followed up 04/12/2019 with Dr. HDebara Pickettand was doing well.  She reported some issues with iatrogenic hyperthyroidism.  Her medications were adjusted.  She also reported that she had been in the emergency room with acute renal failure.  Her renal failure was thought to be related to her angiotensin receptor blocker and it was discontinued.  Her creatinine recovered back to the 1.2 range from 2.0.  She was continued on Aldactone and metoprolol.  She denied palpitations.  She followed up 08/23/2020 and continues to do well.  Her renal function continued to decrease in her diuretic medication was discontinued.  She was continued on spironolactone.  Her EKG at that time showed normal sinus rhythm with right bundle branch block.  She denied chest pain, did have some persistent shortness of breath with exertion and had noted some weight gain.  It was unclear whether her shortness of breath was related to her increased weight.  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  A cardiopulmonary exercise test was ordered and showed normal function/gas exchange and no cardiopulmonary abnormality.  It was felt that her primary limitation was  due to her body habitus.  She presents the clinic today for follow-up evaluation states she continues to be fatigued with increased physical activity.  She reports that she does enjoy activities such as gardening but does not have any formal exercise routine.  We reviewed her pulmonary stress test and reviewed the importance of maintaining physical fitness.  She expressed understanding.  She does attribute some of her increased work of breathing and fatigue to increased weight gain.  We reviewed the importance of balanced heart healthy diet.  I will give her the salty 6 diet sheet, have her  increase her physical activity as tolerated (goal 150 minutes of moderate physical activity per week).  I have asked her to slowly increase her physical activity no more than 10% increases in a week.  We will have her follow-up in 6 months and as needed.  Today she denies chest pain, increased shortness of breath, increased lower extremity edema, fatigue, palpitations, melena, hematuria, hemoptysis, diaphoresis, weakness, presyncope, syncope, orthopnea, and PND.   Home Medications    Prior to Admission medications   Medication Sig Start Date End Date Taking? Authorizing Provider  albuterol (PROVENTIL HFA;VENTOLIN HFA) 108 (90 Base) MCG/ACT inhaler Inhale 2 puffs into the lungs every 4 (four) hours as needed for wheezing or shortness of breath.    [provider]  B Complex-C (B-COMPLEX WITH VITAMIN C) tablet Take 1 tablet by mouth daily.    [provider]  Cholecalciferol (VITAMIN D-3) 5000 units TABS Take 5,000 Units by mouth daily.    [provider]  cycloSPORINE (RESTASIS) 0.05 % ophthalmic emulsion Restasis 0.05 % eye drops in a dropperette    [provider]  famotidine (PEPCID) 40 MG tablet Take 40 mg by mouth 2 (two) times daily.    [provider]  fenofibrate 160 MG tablet Take 160 mg by mouth daily.    [provider]  Iron-FA-B Cmp-C-Biot-Probiotic (FUSION PLUS PO) Take 1 tablet by mouth daily. Patient not taking: Reported on 08/23/2020    [provider]  levothyroxine (SYNTHROID) 25 MCG tablet Take 25 mcg by mouth daily before breakfast.    [provider]  magnesium gluconate (MAGONATE) 30 MG tablet Take 30 mg by mouth daily.    [provider]  metoprolol succinate (TOPROL-XL) 50 MG 24 hr tablet TAKE 1 TABLET BY MOUTH EVERY DAY AFTER A MEAL 11/12/20   Hilty, Nadean Corwin, MD  NP THYROID 60 MG tablet Take 30 mg by mouth daily before breakfast. 10/14/18   [provider]  OVER THE COUNTER MEDICATION  Apply 1 application topically daily as needed (cramping). Magnesium topical cream - Magne Sport - uses for cramping    [provider]  Respiratory Therapy Supplies (FLUTTER) DEVI Use as directed 01/08/16   Juanito Doom, MD  sodium chloride HYPERTONIC 3 % nebulizer solution Take by nebulization as needed for other. 08/01/16   Juanito Doom, MD  spironolactone (ALDACTONE) 50 MG tablet TAKE 1 TABLET(50 MG) BY MOUTH DAILY 07/23/20   Hilty, Nadean Corwin, MD  valACYclovir (VALTREX) 500 MG tablet Take 500 mg by mouth daily.    [provider]    Family History    Family History  Problem Relation Age of Onset   Prostate cancer Maternal Grandfather    Tuberculosis Mother    Tuberculosis Brother    Mesothelioma Brother    Multiple myeloma Maternal Grandmother    Pancreatic cancer Paternal Grandmother    Prostate cancer Paternal  Uncle    Prostate cancer Paternal Uncle    Prostate cancer Cousin    Prostate cancer Cousin    She indicated that the status of her mother is unknown. She indicated that her maternal grandmother is deceased. She indicated that her maternal grandfather is deceased. She indicated that her paternal grandmother is deceased. She indicated that her paternal grandfather is deceased. She reported the following about one of her cousins: paternal. She reported the following about her other cousin of unknown status: Paternal.  Social History    Social History   Socioeconomic History   Marital status: Single    Spouse name: Not on file   Number of children: Not on file   Years of education: Not on file   Highest education level: Not on file  Occupational History   Not on file  Tobacco Use   Smoking status: Former    Packs/day: 1.00    Years: 1.00    Pack years: 1.00    Types: Cigarettes    Quit date: 06/09/1961    Years since quitting: 59.6   Smokeless tobacco: Never   Tobacco comments:    social smoker- never a habit.    Vaping Use   Vaping Use:  Never used  Substance and Sexual Activity   Alcohol use: No    Alcohol/week: 0.0 standard drinks   Drug use: No   Sexual activity: Not on file  Other Topics Concern   Not on file  Social History Narrative   Not on file   Social Determinants of Health   Financial Resource Strain: Not on file  Food Insecurity: Not on file  Transportation Needs: Not on file  Physical Activity: Not on file  Stress: Not on file  Social Connections: Not on file  Intimate Partner Violence: Not on file     Review of Systems    General:  No chills, fever, night sweats or weight changes.  Cardiovascular:  No chest pain, dyspnea on exertion, edema, orthopnea, palpitations, paroxysmal nocturnal dyspnea. Dermatological: No rash, lesions/masses Respiratory: No cough, dyspnea Urologic: No hematuria, dysuria Abdominal:   No nausea, vomiting, diarrhea, bright red blood per rectum, melena, or hematemesis Neurologic:  No visual changes, wkns, changes in mental status. All other systems reviewed and are otherwise negative except as noted above.  Physical Exam    VS:  BP 132/84   Pulse 72   Ht 5' 2.5" (1.588 m)   Wt 196 lb (88.9 kg)   SpO2 96%   BMI 35.28 kg/m  , BMI Body mass index is 35.28 kg/m. GEN: Well nourished, well developed, in no acute distress. HEENT: normal. Neck: Supple, no JVD, carotid bruits, or masses. Cardiac: RRR, 2/6 systolic murmur heard along apex, rubs, or gallops. No clubbing, cyanosis, edema.  Radials/DP/PT 2+ and equal bilaterally.  Respiratory:  Respirations regular and unlabored, clear to auscultation bilaterally. GI: Soft, nontender, nondistended, BS + x 4. MS: no deformity or atrophy. Skin: warm and dry, no rash. Neuro:  Strength and sensation are intact. Psych: Normal affect.  Accessory Clinical Findings    Recent Labs: No results found for requested labs within last 8760 hours.   Recent Lipid Panel No results found for: CHOL, TRIG, HDL, CHOLHDL, VLDL, LDLCALC,  LDLDIRECT  ECG personally reviewed by me today- None today  Cardiopulmonary exercise test 12/20/2020 Interpretation   Notes: Patient gave a very good effort. Pulse-oximetry remained 98% for the duration of exercise.   ECG:  Resting ECG in normal sinus rhythm.  HR response appropriate. There were no sustained arrhythmias or ST-T changes. BP response mildly hypertensive at peak exercise.   PFT:  Pre-exercise spirometry was within normal limits. The MVV was normal.   CPX:  Exercise testing with gas exchange demonstrates a normal peak VO2 of 16.2 ml/kg/min (111% of the age/gender/weight matched sedentary norms). The RER of 1.05 indicates a near maximal effort. When adjusted to the patient's ideal body weight of 135 lb (61.2 kg) the peak VO2 is 23.0 ml/kg (ibw)/min (128% of the ibw-adjusted predicted). The VE/VCO2 slope is mildly elevated and indicates mildly increased dead space ventialtion. The oxygen uptake efficiency slope (OUES) is normal. The VO2 at the ventilatory threshold was normal at 86% of the predicted peak VO2. At peak exercise, the ventilation reached 80% of the measured MVV indicating ventilatory reserve was nearly depleted. The O2pulse (a surrogate for stroke volume)  Increased with incremental exercise, reaching peak at 11 ml/beat (122% predicted).    Conclusion: Exercise testing with gas exchange demonstrates normal functional capacity when compared to matched sedentary norms. There is no cardiopulmonary abnormality. With corrections for ideal body weight, patient's VO2 increases. Patient appears primarily limited due to her body habitus.    Test, report and preliminary impression by:  Landis Martins, MS, ACSM-RCEP  12/20/2020 11:16 AM   Assessment & Plan   1.  Dyspnea on exertion-underwent cardiopulmonary exercise testing which showed normal response to exercise.  12/20/2020.  Suggested limitations related to body habitus.  Details above.  Study reviewed with patient.  She  expressed understanding. Heart healthy low-sodium diet-salty 6 given Increase physical activity as tolerated-goal 150 minutes of moderate physical activity per week. Continue weight loss  Palpitations-no recent episodes of increased palpitations. Continue metoprolol Heart healthy low-sodium diet-salty 6 given Increase physical activity as tolerated Avoid triggers caffeine, chocolate, EtOH, dehydration etc.  Cardiac murmur-echocardiogram 2017 showed trivial mitral valve regurgitation. Repeat echocardiogram when clinically indicated Increase physical activity as tolerated  Lower extremity edema-euvolemic today. Continue spironolactone Recommended lower extremity support stockings Heart healthy low-sodium diet-salty 6 given Increase physical activity as tolerated   Disposition: Follow-up with Dr. Debara Pickett in 6-9 months.  Jossie Ng. Rondo Spittler NP-C    01/07/2021, 4:45 PM [facility] [address] Office [phone] Fax [phone]  Notice: This dictation was prepared with Dragon dictation along with smaller phrase technology. Any transcriptional errors that result from this process are unintentional and may not be corrected upon review.  I spent 15 minutes examining this patient, reviewing medications, and using patient centered shared decision making involving her cardiac care.  Prior to her visit I spent greater than 20 minutes reviewing her past medical history,  medications, and prior cardiac tests.

## 2021-01-07 ENCOUNTER — Encounter: Payer: Self-pay | Admitting: General Practice

## 2021-01-07 ENCOUNTER — Other Ambulatory Visit: Payer: Self-pay

## 2021-01-07 ENCOUNTER — Ambulatory Visit: Payer: Medicare PPO | Admitting: General Practice

## 2021-01-07 VITALS — BP 132/84 | HR 72 | Ht 62.5 in | Wt 196.0 lb

## 2021-01-07 DIAGNOSIS — R002 Palpitations: Secondary | ICD-10-CM | POA: Diagnosis not present

## 2021-01-07 DIAGNOSIS — R0602 Shortness of breath: Secondary | ICD-10-CM | POA: Diagnosis not present

## 2021-01-07 DIAGNOSIS — I059 Rheumatic mitral valve disease, unspecified: Secondary | ICD-10-CM

## 2021-01-07 DIAGNOSIS — R600 Localized edema: Secondary | ICD-10-CM | POA: Diagnosis not present

## 2021-01-07 NOTE — Patient Instructions (Addendum)
Medication Instructions:  The current medical regimen is effective;  continue present plan and medications as directed. Please refer to the Current Medication list given to you today.  *If you need a refill on your cardiac medications before your next appointment, please call your pharmacy*  Lab Work:   Testing/Procedures:  NONE    NONE  Special Instructions PLEASE READ AND FOLLOW SALTY 6-ATTACHED-1,'800mg'$  daily  PLEASE INCREASE PHYSICAL ACTIVITY AS TOLERATED,   Follow-Up: Your next appointment:  6 month(s) In Person with K. Mali Hilty, MD OR IF UNAVAILABLE [place], FNP-C   At [facility], you and your health needs are our priority.  As part of our continuing mission to provide you with exceptional heart care, we have created designated Provider Care Teams.  These Care Teams include your primary Cardiologist (physician) and Advanced Practice Providers (APPs -  Physician Assistants and Nurse Practitioners) who all work together to provide you with the care you need, when you need it.            6 SALTY THINGS TO AVOID     1,'800MG'$  DAILY

## 2021-01-11 ENCOUNTER — Other Ambulatory Visit: Payer: Self-pay | Admitting: Obstetrics & Gynecology

## 2021-01-11 DIAGNOSIS — M8580 Other specified disorders of bone density and structure, unspecified site: Secondary | ICD-10-CM

## 2021-01-27 ENCOUNTER — Other Ambulatory Visit: Payer: Self-pay | Admitting: Internal Medicine

## 2021-01-27 DIAGNOSIS — I10 Essential (primary) hypertension: Secondary | ICD-10-CM

## 2021-06-30 NOTE — Progress Notes (Signed)
Amanda Bender, female    DOB: 19-Jun-1940,    MRN: [id number]   Brief patient profile:  65 yobf minimal smoking hx previously eval by Dr Lake Bells for bronchiectasis self referred to pulmonary clinic 07/01/2021     Note CPST  12/20/20: Exercise testing with gas exchange demonstrates normal functional capacity when compared to matched sedentary norms   History of Present Illness  07/01/2021  Pulmonary/ 1st office eval/Timberly Yott  Chief Complaint  Patient presents with   Pulmonary Consult    Seen previously by Dr Lake Bells- last visit 08/01/2016.  Pt c/o increased DOE over the past for the past 2 years. She states she gets SOB with "almost anything except being at rest".   Dyspnea:  onset doe x steps onset around 2020 at baseline  now x 50 ft  Cough: variable assoc with pnd clear mucus or slt yellowish bloody  Sleep: not as well since 5 years since living alone  but no resp cc  SABA use: none   No obvious day to day or daytime variability or assoc excess/ purulent sputum or mucus plugs or hemoptysis or cp or chest tightness, subjective wheeze or overt sinus or hb symptoms.   Sleeping  without nocturnal  or early am exacerbation  of respiratory  c/o's or need for noct saba. Also denies any obvious fluctuation of symptoms with weather or environmental changes or other aggravating or alleviating factors except as outlined above   No unusual exposure hx or h/o childhood pna/ asthma or knowledge of premature birth.  Current Allergies, Complete Past Medical History, Past Surgical History, Family History, and Social History were reviewed in [facility] record.  ROS  The following are not active complaints unless bolded Hoarseness, sore throat, dysphagia, dental problems, itching, sneezing,  nasal congestion or discharge of excess mucus or purulent secretions, ear ache,   fever, chills, sweats, unintended wt loss or wt gain, classically pleuritic or exertional cp,  orthopnea pnd  or arm/hand swelling  or leg swelling, presyncope, palpitations, abdominal pain, anorexia, nausea, vomiting, diarrhea  or change in bowel habits or change in bladder habits, change in stools or change in urine, dysuria, hematuria,  rash, arthralgias, visual complaints, headache, numbness, weakness or ataxia or problems with walking or coordination,  change in mood or  memory.                Past Medical History:  Diagnosis Date   Anemia    low iron   Anxiety    Arthritis    Bronchiectasis (HCC)    Complex renal cyst    Complication of anesthesia    slow to wake up   Cramps, extremity    Diverticulosis    Dyspnea    with exertion   Esophageal ulcer    GERD (gastroesophageal reflux disease)    Headache    migraines in the past - none since menopause   Heart murmur    History of hiatal hernia    Hypertension    Hyperthyroidism    as been treated (Graves disease) now hypothyroid   Hypothyroidism    Mitral prolapse    Pneumonia    PVC's (premature ventricular contractions)    Thyroid disease     Outpatient Medications Prior to Visit  Medication Sig Dispense Refill   B Complex-C (B-COMPLEX WITH VITAMIN C) tablet Take 1 tablet by mouth daily.     Boric Acid Vaginal 600 MG SUPP As directed     cycloSPORINE (RESTASIS)  0.05 % ophthalmic emulsion Restasis 0.05 % eye drops in a dropperette     famotidine (PEPCID) 40 MG tablet Take 40 mg by mouth 2 (two) times daily.     fenofibrate 160 MG tablet Take 160 mg by mouth daily.     hydrOXYzine (ATARAX) 10 MG/5ML syrup Take 12.5 mg by mouth 3 (three) times daily as needed.     levothyroxine (SYNTHROID) 25 MCG tablet Take 25 mcg by mouth daily before breakfast.     magnesium gluconate (MAGONATE) 30 MG tablet Take 30 mg by mouth daily.     MAGNESIUM PO Take 1 tablet by mouth daily.     metoprolol succinate (TOPROL-XL) 50 MG 24 hr tablet TAKE 1 TABLET BY MOUTH EVERY DAY AFTER A MEAL 90 tablet 2   NP THYROID 60 MG tablet Take 30 mg by  mouth daily before breakfast.     nystatin-triamcinolone ointment (MYCOLOG) As directed     OVER THE COUNTER MEDICATION Apply 1 application topically daily as needed (cramping). Magnesium topical cream - Magne Sport - uses for cramping     Respiratory Therapy Supplies (FLUTTER) DEVI Use as directed 1 each 0   spironolactone (ALDACTONE) 50 MG tablet TAKE 1 TABLET(50 MG) BY MOUTH DAILY 90 tablet 1   valACYclovir (VALTREX) 500 MG tablet Take 500 mg by mouth daily.     Vitamin D-Vitamin K (VITAMIN K2-VITAMIN D3 PO) Take 1 tablet by mouth daily.     albuterol (PROVENTIL HFA;VENTOLIN HFA) 108 (90 Base) MCG/ACT inhaler Inhale 2 puffs into the lungs every 4 (four) hours as needed for wheezing or shortness of breath.     Cholecalciferol (VITAMIN D-3) 5000 units TABS Take 5,000 Units by mouth daily.     sodium chloride HYPERTONIC 3 % nebulizer solution Take by nebulization as needed for other. 750 mL 11   No facility-administered medications prior to visit.     Objective:     BP 128/74 (BP Location: Left Arm, Cuff Size: Normal)    Pulse 69    Temp 98.2 F (36.8 C) (Oral)    Ht 5' 2.5" (1.588 m)    Wt 187 lb (84.8 kg)    SpO2 98% Comment: on RA   BMI 33.66 kg/m   SpO2: 98 % (on RA)  Amb bf mod obese by BMI / nad   HEENT : pt wearing mask not removed for exam due to covid -19 concerns.    NECK :  without JVD/Nodes/TM/ nl carotid upstrokes bilaterally   LUNGS: no acc muscle use,  Nl contour chest which is clear to A and P bilaterally without cough on insp or exp maneuvers   CV:  RRR  no s3 or murmur or increase in P2, and no edema   ABD:  soft and nontender with nl inspiratory excursion in the supine position. No bruits or organomegaly appreciated, bowel sounds nl  MS:  Nl gait/ ext warm without deformities, calf tenderness, cyanosis or clubbing No obvious joint restrictions   SKIN: warm and dry without lesions    NEURO:  alert, approp, nl sensorium with  no motor or cerebellar  deficits apparent.    CXR PA and Lateral:   07/01/2021 :    I personally reviewed images and impression is as follows:     No acute changes    Labs ordered/ reviewed:      Chemistry      Component Value Date/Time   NA 140 07/01/2021 1426   K 3.8 07/01/2021 1426  CL 105 07/01/2021 1426   CO2 25 07/01/2021 1426   BUN 35 (H) 07/01/2021 1426   CREATININE 1.39 (H) 07/01/2021 1426   CREATININE 1.01 (H) 08/10/2015 1231      Component Value Date/Time   CALCIUM 9.2 07/01/2021 1426   ALKPHOS 30 (L) 07/23/2018 1520   AST 24 07/23/2018 1520   ALT 21 07/23/2018 1520   BILITOT 0.4 07/23/2018 1520        Lab Results  Component Value Date   WBC 8.8 07/01/2021   HGB 12.5 07/01/2021   HCT 39.7 07/01/2021   MCV 80.2 07/01/2021   PLT 312.0 07/01/2021        E0S                                                              0.0                                    07/01/2021   Lab Results  Component Value Date   DDIMER 1.58 (H) 07/01/2021         Assessment   DOE (dyspnea on exertion) Onset Nov 2016 p flu like illness  -Cardiopulmonary stress test forms in 2015 in [place] showed a VO2 max of 13 mL per K per minute low); minute ventilation at peak was 32 L/m which was less than 80% predicted capacity -Pulmonary function testing 2015 as part of cardiopulmonary exercise test: Ratio 82%, FEV1 1.44 L (166% predicted), FVC 1.74 L (150% predicted), DLCO 13.79 (88% predicted) -June 2017 pulmonary function testing ratio 85%, FEV1 1.59 L (101% predicted), FVC 2.03 L (100% predicted), total lung capacity 3.47 L (73% predicted), DLCO 14.64 (67% predicted). -CPST  12/20/20: V02 max 16 ml/kg/min Exercise testing with gas exchange demonstrates normal functional capacity when compared to matched sedentary norms - Spirometry  04/25/2021  FEV1 1.50 (121%)  Ratio 0.76 minimal concavity to f/v curve, truncated insp loop but no plateau  - 07/01/2021   Walked on RA  x  3  lap(s) =  approx 750  ft  @ fast  pace, stopped due to sob with lowest 02 sats 98%    Symptoms are markedly disproportionate to objective findings and not clear to what extent this is actually a pulmonary  problem but pt does appear to have difficult to sort out respiratory symptoms of unknown origin for which  DDX  = almost all start with A and  include Adherence, Ace Inhibitors, Acid Reflux, Active Sinus Disease, Alpha 1 Antitripsin deficiency, Anxiety masquerading as Airways dz,  ABPA,  Allergy(esp in young), Aspiration (esp in elderly), Adverse effects of meds,  Active smoking or Vaping, A bunch of PE's/clot burden (a few small clots can't cause this syndrome unless there is already severe underlying pulm or vascular dz with poor reserve),  Anemia/ thyroid disorder or metabolic dz/ plus two Bs  = Bronchiectasis and Beta blocker use..and one C= CHF   Adherence is always the initial "prime suspect" and is a multilayered concern that requires a "trust but verify" approach in every patient - starting with knowing how to use medications, especially inhalers, correctly, keeping up with refills and understanding the fundamental difference between maintenance and prns vs  those medications only taken for a very short course and then stopped and not refilled.   ? Anxiety/deconditioning  > usually at the bottom of this list of usual suspects but   may interfere with adherence and also interpretation of response or lack thereof to symptom management which can be quite subjective.  rec reconditioning   ? Adverse drug effects > none of the usual suspects listed   Anemia ruled out, TSH ok in nov 2022 by PCP labs -nowt HC03 was down with recent AKI but back to nl now   ? Allergy /asthma > pft nl, check allergy screen but s prominent rhinitis symptoms doubt  ?  A bunch of PEs > unfortunately D dimer well over upper limits of normal but has h/o renal insfufficiency so will do venous dopplers and v/q to rule out  ? Bronchiectasis > clearly not a  major problem in terms of symptoms at this point   F/u can be as needed as long as making progress with regular paced submax exercise - see avs for instructions unique to this ov     Each maintenance medication was reviewed in detail including emphasizing most importantly the difference between maintenance and prns and under what circumstances the prns are to be triggered using an action plan format where appropriate.  Total time for H and P, chart review, counseling,  directly observing portions of ambulatory 02 saturation study/ and generating customized AVS unique to this office visit / same day charting with pt new to me and extensive records to review  =  47 min                         Christinia Gully, MD 07/01/2021

## 2021-07-01 ENCOUNTER — Encounter: Payer: Self-pay | Admitting: Internal Medicine

## 2021-07-01 ENCOUNTER — Ambulatory Visit: Payer: Medicare PPO | Admitting: Internal Medicine

## 2021-07-01 ENCOUNTER — Other Ambulatory Visit: Payer: Self-pay

## 2021-07-01 ENCOUNTER — Ambulatory Visit (INDEPENDENT_AMBULATORY_CARE_PROVIDER_SITE_OTHER): Payer: Medicare PPO

## 2021-07-01 DIAGNOSIS — R0609 Other forms of dyspnea: Secondary | ICD-10-CM | POA: Diagnosis not present

## 2021-07-01 LAB — CBC WITH DIFFERENTIAL/PLATELET
Basophils Absolute: 0 10*3/uL (ref 0.0–0.1)
Basophils Relative: 0.2 % (ref 0.0–3.0)
Eosinophils Absolute: 0 10*3/uL (ref 0.0–0.7)
Eosinophils Relative: 0 % (ref 0.0–5.0)
HCT: 39.7 % (ref 36.0–46.0)
Hemoglobin: 12.5 g/dL (ref 12.0–15.0)
Lymphocytes Relative: 20.9 % (ref 12.0–46.0)
Lymphs Abs: 1.8 10*3/uL (ref 0.7–4.0)
MCHC: 31.5 g/dL (ref 30.0–36.0)
MCV: 80.2 fl (ref 78.0–100.0)
Monocytes Absolute: 0.6 10*3/uL (ref 0.1–1.0)
Monocytes Relative: 7.2 % (ref 3.0–12.0)
Neutro Abs: 6.3 10*3/uL (ref 1.4–7.7)
Neutrophils Relative %: 71.7 % (ref 43.0–77.0)
Platelets: 312 10*3/uL (ref 150.0–400.0)
RBC: 4.95 Mil/uL (ref 3.87–5.11)
RDW: 15 % (ref 11.5–15.5)
WBC: 8.8 10*3/uL (ref 4.0–10.5)

## 2021-07-01 LAB — BASIC METABOLIC PANEL
BUN: 35 mg/dL — ABNORMAL HIGH (ref 6–23)
CO2: 25 mEq/L (ref 19–32)
Calcium: 9.2 mg/dL (ref 8.4–10.5)
Chloride: 105 mEq/L (ref 96–112)
Creatinine, Ser: 1.39 mg/dL — ABNORMAL HIGH (ref 0.40–1.20)
GFR: 36.14 mL/min — ABNORMAL LOW (ref >60.00)
Glucose, Bld: 75 mg/dL (ref 70–99)
Potassium: 3.8 mEq/L (ref 3.5–5.1)
Sodium: 140 mEq/L (ref 135–145)

## 2021-07-01 NOTE — Assessment & Plan Note (Addendum)
Onset Nov 2016 p flu like illness  -Cardiopulmonary stress test forms in 2015 in [place] showed a VO2 max of 13 mL per K per minute low); minute ventilation at peak was 32 L/m which was less than 80% predicted capacity -Pulmonary function testing 2015 as part of cardiopulmonary exercise test: Ratio 82%, FEV1 1.44 L (166% predicted), FVC 1.74 L (150% predicted), DLCO 13.79 (88% predicted) -June 2017 pulmonary function testing ratio 85%, FEV1 1.59 L (101% predicted), FVC 2.03 L (100% predicted), total lung capacity 3.47 L (73% predicted), DLCO 14.64 (67% predicted). -CPST  12/20/20: V02 max 16 ml/kg/min Exercise testing with gas exchange demonstrates normal functional capacity when compared to matched sedentary norms - Spirometry  04/25/2021  FEV1 1.50 (121%)  Ratio 0.76 minimal concavity to f/v curve, truncated insp loop but no plateau  - 07/01/2021   Walked on RA  x  3  lap(s) =  approx 750  ft  @ fast pace, stopped due to sob with lowest 02 sats 98%    Symptoms are markedly disproportionate to objective findings and not clear to what extent this is actually a pulmonary  problem but pt does appear to have difficult to sort out respiratory symptoms of unknown origin for which  DDX  = almost all start with A and  include Adherence, Ace Inhibitors, Acid Reflux, Active Sinus Disease, Alpha 1 Antitripsin deficiency, Anxiety masquerading as Airways dz,  ABPA,  Allergy(esp in young), Aspiration (esp in elderly), Adverse effects of meds,  Active smoking or Vaping, A bunch of PE's/clot burden (a few small clots can't cause this syndrome unless there is already severe underlying pulm or vascular dz with poor reserve),  Anemia/ thyroid disorder or metabolic dz/ plus two Bs  = Bronchiectasis and Beta blocker use..and one C= CHF   Adherence is always the initial "prime suspect" and is a multilayered concern that requires a "trust but verify" approach in every patient - starting with knowing how to use medications,  especially inhalers, correctly, keeping up with refills and understanding the fundamental difference between maintenance and prns vs those medications only taken for a very short course and then stopped and not refilled.   ? Anxiety/deconditioning  > usually at the bottom of this list of usual suspects but   may interfere with adherence and also interpretation of response or lack thereof to symptom management which can be quite subjective.  rec reconditioning   ? Adverse drug effects > none of the usual suspects listed   Anemia ruled out, TSH ok in nov 2022 by PCP labs -nowt HC03 was down with recent AKI but back to nl now   ? Allergy /asthma > pft nl, check allergy screen but s prominent rhinitis symptoms doubt  ?  A bunch of PEs > unfortunately D dimer well over upper limits of normal but has h/o renal insfufficiency so will do venous dopplers and v/q to rule out  ? Bronchiectasis > clearly not a major problem in terms of symptoms at this point   F/u can be as needed as long as making progress with regular paced submax exercise - see avs for instructions unique to this ov     Each maintenance medication was reviewed in detail including emphasizing most importantly the difference between maintenance and prns and under what circumstances the prns are to be triggered using an action plan format where appropriate.  Total time for H and P, chart review, counseling,  directly observing portions of ambulatory 02 saturation study/  and generating customized AVS unique to this office visit / same day charting with pt new to me and extensive records to review  =  47 min

## 2021-07-01 NOTE — Patient Instructions (Addendum)
We will walk you today at the pace you feel you are short of breath but not out of breath.  To get the most out of exercise, you need to be continuously aware that you are short of breath, but never out of breath, for at least 30 minutes daily. As you improve, it will actually be easier for you to do the same amount of exercise  in  30 minutes so always push to the level where you are short of breath.  Once you can do this, push for longer duration or repeat it after at least 4 hours of rest.  Make sure you check your oxygen saturations at highest level of activity (pulse oximeter)   Please remember to go to the lab and x-ray department  for your tests - we will call you with the results when they are available.  Pulmonary follow up is as needed.

## 2021-07-02 ENCOUNTER — Encounter: Payer: Self-pay | Admitting: Internal Medicine

## 2021-07-02 LAB — IGE: IgE (Immunoglobulin E), Serum: 31 kU/L (ref <=114)

## 2021-07-02 LAB — D-DIMER, QUANTITATIVE: D-Dimer, Quant: 1.58 mcg/mL FEU — ABNORMAL HIGH (ref <0.50)

## 2021-07-04 ENCOUNTER — Other Ambulatory Visit: Payer: Self-pay | Admitting: Internal Medicine

## 2021-07-04 DIAGNOSIS — R600 Localized edema: Secondary | ICD-10-CM

## 2021-07-04 DIAGNOSIS — R0609 Other forms of dyspnea: Secondary | ICD-10-CM

## 2021-07-04 NOTE — Progress Notes (Signed)
Spoke with pt and notified of results per Dr. Melvyn Novas. Pt verbalized understanding and denied any questions. Perfusion scan and dopplers ordered

## 2021-07-09 ENCOUNTER — Encounter (HOSPITAL_COMMUNITY): Payer: Medicare PPO

## 2021-07-09 ENCOUNTER — Other Ambulatory Visit (HOSPITAL_COMMUNITY): Payer: Medicare PPO

## 2021-07-10 ENCOUNTER — Ambulatory Visit (HOSPITAL_COMMUNITY)
Admission: RE | Admit: 2021-07-10 | Discharge: 2021-07-10 | Disposition: A | Discharge status: Home / Self Care | Payer: Medicare PPO | Source: Ambulatory Visit | Attending: Internal Medicine | Admitting: Internal Medicine

## 2021-07-10 ENCOUNTER — Other Ambulatory Visit: Payer: Self-pay

## 2021-07-10 DIAGNOSIS — R0609 Other forms of dyspnea: Secondary | ICD-10-CM | POA: Insufficient documentation

## 2021-07-10 DIAGNOSIS — R600 Localized edema: Secondary | ICD-10-CM | POA: Insufficient documentation

## 2021-07-10 NOTE — Progress Notes (Signed)
Bilateral lower extremity venous duplex completed. Refer to "CV Proc" under chart review to view preliminary results.  07/10/2021 4:18 PM Kelby Aline., MHA, RVT, RDCS, RDMS

## 2021-07-11 ENCOUNTER — Telehealth: Payer: Self-pay | Admitting: Internal Medicine

## 2021-07-11 ENCOUNTER — Encounter (HOSPITAL_COMMUNITY)
Admission: RE | Admit: 2021-07-11 | Discharge: 2021-07-11 | Disposition: A | Discharge status: Home / Self Care | Payer: Medicare PPO | Source: Ambulatory Visit | Attending: Internal Medicine | Admitting: Internal Medicine

## 2021-07-11 ENCOUNTER — Ambulatory Visit (HOSPITAL_COMMUNITY)
Admission: RE | Admit: 2021-07-11 | Discharge: 2021-07-11 | Disposition: A | Discharge status: Home / Self Care | Payer: Medicare PPO | Source: Ambulatory Visit | Attending: Internal Medicine | Admitting: Internal Medicine

## 2021-07-11 DIAGNOSIS — R0609 Other forms of dyspnea: Secondary | ICD-10-CM | POA: Diagnosis present

## 2021-07-11 MED ORDER — TECHNETIUM TO 99M ALBUMIN AGGREGATED
4.1000 | Freq: Once | INTRAVENOUS | Status: AC | PRN
  Administered 2021-07-11: 4.1 via INTRAVENOUS

## 2021-07-11 NOTE — Telephone Encounter (Signed)
Received call report from Opal Sidles with [facility] on patient's VQ scan done on 07/11/21. Dr. Melvyn Novas, please review the result/impression copied below:  IMPRESSION: Single peripheral wedge-shaped perfusion defect within the anterior right upper lobe is identified without corresponding chest radiograph abnormality. Cannot exclude underlying pulmonary embolus.  Please advise, thank you.

## 2021-07-11 NOTE — Telephone Encounter (Signed)
Leg  studies are normal so very unlikely any clots,  Be sure patient has/keeps f/u ov so we can go over all the details of this study and get a plan together moving forward - ok to move up f/u if not feeling better and wants to be seen sooner

## 2021-07-11 NOTE — Telephone Encounter (Signed)
Called pt and there was no answer-LMTCB °

## 2021-07-11 NOTE — Telephone Encounter (Signed)
ATC LVMTCB x 1  

## 2021-07-11 NOTE — Telephone Encounter (Signed)
Spoke with the pt and notified of response per Dr Wert  She verbalized understanding  Nothing further needed 

## 2021-07-11 NOTE — Telephone Encounter (Signed)
[place] [facility] calling with a STAT call report.

## 2021-07-12 ENCOUNTER — Encounter (HOSPITAL_COMMUNITY): Payer: Medicare PPO

## 2021-07-18 ENCOUNTER — Encounter: Payer: Self-pay | Admitting: Internal Medicine

## 2021-07-18 ENCOUNTER — Ambulatory Visit: Payer: Medicare PPO | Admitting: Internal Medicine

## 2021-07-18 ENCOUNTER — Ambulatory Visit
Admission: RE | Admit: 2021-07-18 | Discharge: 2021-07-18 | Disposition: A | Discharge status: Home / Self Care | Payer: Medicare PPO | Source: Ambulatory Visit | Attending: Obstetrics & Gynecology | Admitting: Obstetrics & Gynecology

## 2021-07-18 ENCOUNTER — Other Ambulatory Visit: Payer: Self-pay

## 2021-07-18 DIAGNOSIS — J479 Bronchiectasis, uncomplicated: Secondary | ICD-10-CM

## 2021-07-18 DIAGNOSIS — M8580 Other specified disorders of bone density and structure, unspecified site: Secondary | ICD-10-CM

## 2021-07-18 DIAGNOSIS — R0609 Other forms of dyspnea: Secondary | ICD-10-CM

## 2021-07-18 NOTE — Patient Instructions (Addendum)
Ok to try albuterol 15 min before an activity (on alternating days)  that you know would usually make you short of breath and see if it makes any difference and if makes none then don't take albuterol after activity unless you can't catch your breath as this means it's the resting that helps, not the albuterol.     Work on inhaler technique:  relax and gently blow all the way out then take a nice smooth full deep breath back in, triggering the inhaler at same time you start breathing in.  Hold for up to 5 seconds if you can.   We will schedule a CT of your Chest and sinus and call you with the results   Pulmonary follow up is as needed

## 2021-07-18 NOTE — Assessment & Plan Note (Signed)
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  This may explain the RUL defect on V/Q  And the cough so worth repeating HRCT to work up both the sinus ct same time to eval latter but doubt further pulmonary f/u needed.   Discussed in detail all the  indications, usual  risks and alternatives  relative to the benefits with patient who agrees to proceed with w/u as outlined.            Each maintenance medication was reviewed in detail including emphasizing most importantly the difference between maintenance and prns and under what circumstances the prns are to be triggered using an action plan format where appropriate.  Total time for H and P, chart review, counseling, reviewing how to use hfa device(s) and generating customized AVS unique to this office visit / same day charting = 25 min

## 2021-07-18 NOTE — Progress Notes (Signed)
Amanda Bender, female    DOB: 09-01-1940,    MRN: [id number]   Brief patient profile:  70 yobf minimal smoking hx previously eval by Dr Amanda Bender for bronchiectasis self referred to pulmonary clinic 07/01/2021     Note CPST  12/20/20: Exercise testing with gas exchange demonstrates normal functional capacity when compared to matched sedentary norms   History of Present Illness  07/01/2021  Pulmonary/ 1st office eval/Amanda Bender  Chief Complaint  Patient presents with   Pulmonary Consult    Seen previously by Dr Amanda Bender- last visit 08/01/2016.  Pt c/o increased DOE over the past for the past 2 years. She states she gets SOB with "almost anything except being at rest".   Dyspnea:  onset doe x steps onset around 2020 at baseline  now x 50 ft  Cough: variable assoc with pnd clear mucus or slt yellowish bloody  Sleep: not as well since 5 years since living alone  but no resp cc  SABA use: none  Rec We will walk you today at the pace you feel you are short of breath but not out of breath. To get the most out of exercise, you need to be continuously aware that you are short of breath, but never out of breath, for at least 30 minutes daily.   Make sure you check your oxygen saturations at highest level of activity (pulse oximeter)  Please remember to go to the lab and x-ray department  for your tests - we will call you with the results when they are available.   07/18/2021  f/u ov/Amanda Bender re: doe/ cough    maint on no rx   Chief Complaint  Patient presents with   Follow-up    Sob-same, occass. Cough-clear, pnd  Dyspnea:  no regular ex  Cough: no am cough  Sleeping: fine resp wise  SABA use: albuterol ? Helps  02: not using     No obvious day to day or daytime variability or assoc excess/ purulent sputum or mucus plugs or hemoptysis or cp or chest tightness, subjective wheeze or overt sinus or hb symptoms.   Sleeping  without nocturnal  or early am exacerbation  of respiratory  c/o's or need  for noct saba. Also denies any obvious fluctuation of symptoms with weather or environmental changes or other aggravating or alleviating factors except as outlined above   No unusual exposure hx or h/o childhood pna/ asthma or knowledge of premature birth.  Current Allergies, Complete Past Medical History, Past Surgical History, Family History, and Social History were reviewed in [employer] [facility] record.  ROS  The following are not active complaints unless bolded Hoarseness, sore throat, dysphagia, dental problems, itching, sneezing,  nasal congestion or discharge of excess mucus or purulent secretions, ear ache,   fever, chills, sweats, unintended wt loss or wt gain, classically pleuritic or exertional cp,  orthopnea pnd or arm/hand swelling  or leg swelling, presyncope, palpitations, abdominal pain, anorexia, nausea, vomiting, diarrhea  or change in bowel habits or change in bladder habits, change in stools or change in urine, dysuria, hematuria,  rash, arthralgias, visual complaints, headache, numbness, weakness or ataxia or problems with walking or coordination,  change in mood or  memory.        Current Meds  Medication Sig   B Complex-C (B-COMPLEX WITH VITAMIN C) tablet Take 1 tablet by mouth daily.   Boric Acid Vaginal 600 MG SUPP As directed   cycloSPORINE (RESTASIS) 0.05 % ophthalmic emulsion Restasis  0.05 % eye drops in a dropperette   famotidine (PEPCID) 40 MG tablet Take 40 mg by mouth 2 (two) times daily.   fenofibrate 160 MG tablet Take 160 mg by mouth daily.   hydrOXYzine (ATARAX) 10 MG/5ML syrup Take 12.5 mg by mouth 3 (three) times daily as needed.   levothyroxine (SYNTHROID) 25 MCG tablet Take 25 mcg by mouth daily before breakfast.   MAGNESIUM PO Take 1 tablet by mouth daily.   metoprolol succinate (TOPROL-XL) 50 MG 24 hr tablet TAKE 1 TABLET BY MOUTH EVERY DAY AFTER A MEAL   NP THYROID 60 MG tablet Take 30 mg by mouth daily before breakfast.    nystatin-triamcinolone ointment (MYCOLOG) As directed   OVER THE COUNTER MEDICATION Apply 1 application topically daily as needed (cramping). Magnesium topical cream - Magne Sport - uses for cramping   Respiratory Therapy Supplies (FLUTTER) DEVI Use as directed   spironolactone (ALDACTONE) 50 MG tablet TAKE 1 TABLET(50 MG) BY MOUTH DAILY   valACYclovir (VALTREX) 500 MG tablet Take 500 mg by mouth daily.   Vitamin D-Vitamin K (VITAMIN K2-VITAMIN D3 PO) Take 1 tablet by mouth daily.                 Past Medical History:  Diagnosis Date   Anemia    low iron   Anxiety    Arthritis    Bronchiectasis (HCC)    Complex renal cyst    Complication of anesthesia    slow to wake up   Cramps, extremity    Diverticulosis    Dyspnea    with exertion   Esophageal ulcer    GERD (gastroesophageal reflux disease)    Headache    migraines in the past - none since menopause   Heart murmur    History of hiatal hernia    Hypertension    Hyperthyroidism    as been treated (Graves disease) now hypothyroid   Hypothyroidism    Mitral prolapse    Pneumonia    PVC's (premature ventricular contractions)    Thyroid disease        Objective:     Wt Readings from Last 3 Encounters:  07/18/21 194 lb 3.2 oz (88.1 kg)  07/01/21 187 lb (84.8 kg)  01/07/21 196 lb (88.9 kg)      Vital signs reviewed  07/18/2021  - Note at rest 02 sats  97% on RA   General appearance:    amb pleasant bf nad   HEENT : pt wearing mask not removed for exam due to covid -19 concerns.    NECK :  without JVD/Nodes/TM/ nl carotid upstrokes bilaterally   LUNGS: no acc muscle use,  Nl contour chest which is clear to A and P bilaterally without cough on insp or exp maneuvers   CV:  RRR  no s3 or murmur or increase in P2, and no edema   ABD:  soft and nontender with nl inspiratory excursion in the supine position. No bruits or organomegaly appreciated, bowel sounds nl  MS:  Nl gait/ ext warm without deformities,  calf tenderness, cyanosis or clubbing No obvious joint restrictions   SKIN: warm and dry without lesions    NEURO:  alert, approp, nl sensorium with  no motor or cerebellar deficits apparent.              Assessment

## 2021-07-18 NOTE — Assessment & Plan Note (Signed)
Onset Nov 2016 p flu like illness  -Cardiopulmonary stress test forms in 2015 in [place] showed a VO2 max of 13 mL per K per minute low); minute ventilation at peak was 32 L/m which was less than 80% predicted capacity -Pulmonary function testing 2015 as part of cardiopulmonary exercise test: Ratio 82%, FEV1 1.44 L (166% predicted), FVC 1.74 L (150% predicted), DLCO 13.79 (88% predicted) -June 2017 pulmonary function testing ratio 85%, FEV1 1.59 L (101% predicted), FVC 2.03 L (100% predicted), total lung capacity 3.47 L (73% predicted), DLCO 14.64 (67% predicted). -CPST  12/20/20: V02 max 16 ml/kg/min Exercise testing with gas exchange demonstrates normal functional capacity when compared to matched sedentary norms - Spirometry  04/25/2021  FEV1 1.50 (121%)  Ratio 0.76 minimal concavity to f/v curve, truncated insp loop but no plateau   - 07/01/2021   Walked on RA  x  3  lap(s) =  approx 750  ft  @ fast pace, stopped due to sob with lowest 02 sats 98%  - Allergy profile 07/01/21  >  Eos 0.0/  IgE  31  - D dimer 1.58 07/01/21 >>>Venous dopplers 07/10/21 neg bilaterally  - V/q 07/11/2021 done s V with single upper lobe defect not likely to cause sob and less likely to be PE with neg venous dopplers so just f/u in office   Strongly doubt PE, has not done sub max ex yet but plans to start soon

## 2021-07-26 ENCOUNTER — Other Ambulatory Visit: Payer: Self-pay | Admitting: Internal Medicine

## 2021-07-26 DIAGNOSIS — I10 Essential (primary) hypertension: Secondary | ICD-10-CM

## 2021-07-30 ENCOUNTER — Ambulatory Visit (INDEPENDENT_AMBULATORY_CARE_PROVIDER_SITE_OTHER)
Admission: RE | Admit: 2021-07-30 | Discharge: 2021-07-30 | Disposition: A | Discharge status: Home / Self Care | Payer: Medicare PPO | Source: Ambulatory Visit | Attending: Internal Medicine | Admitting: Internal Medicine

## 2021-07-30 ENCOUNTER — Other Ambulatory Visit: Payer: Self-pay | Admitting: Internal Medicine

## 2021-07-30 ENCOUNTER — Other Ambulatory Visit: Payer: Self-pay

## 2021-07-30 DIAGNOSIS — J479 Bronchiectasis, uncomplicated: Secondary | ICD-10-CM

## 2021-07-30 DIAGNOSIS — J329 Chronic sinusitis, unspecified: Secondary | ICD-10-CM | POA: Diagnosis not present

## 2021-07-31 ENCOUNTER — Encounter: Payer: Self-pay | Admitting: Internal Medicine

## 2021-07-31 ENCOUNTER — Ambulatory Visit: Payer: Medicare PPO | Admitting: Internal Medicine

## 2021-07-31 VITALS — BP 125/68 | HR 63 | Ht 62.0 in | Wt 187.0 lb

## 2021-07-31 DIAGNOSIS — R0602 Shortness of breath: Secondary | ICD-10-CM

## 2021-07-31 DIAGNOSIS — I10 Essential (primary) hypertension: Secondary | ICD-10-CM

## 2021-07-31 DIAGNOSIS — R002 Palpitations: Secondary | ICD-10-CM | POA: Diagnosis not present

## 2021-07-31 NOTE — Patient Instructions (Signed)

## 2021-07-31 NOTE — Progress Notes (Signed)
OFFICE NOTE  Chief Complaint:  Routine follow-up  Primary Care Physician: Willey Blade, MD  HPI:  Amanda Bender is a pleasant 81 year old female with a number of medical problems. 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 Apparently she also has a history of murmur since she was a child and was once diagnosed with mitral valve prolapse in the past the. Her family history is significant mostly for cancer and some heart disease in her father. She was referred for evaluation of lower extremity swelling earlier in the year, for which he says is been occurring over the past several months to a year. The swelling worsens when she is on her feet and improves after elevating them at night. There is no history of varicose veins in the family and she has never been diagnosed with in the past. She does have some neuropathic type pain including pain in both heels, which is yet to have evaluated. She says is painful when she walks especially bearing weight on her heels. She is on a medications that are typically associated with lower extremity swelling. She is also not on a diuretic. Imaging was performed which was negative for any venous insufficiency, and she was diagnosed with lymphedema.   She was now referred back for evaluation of palpitations. She reports a recently she's been having an increase in fatigue, shortness of breath, especially when walking up stairs and associated palpitations. At times she feels like her heart races. She's tried coughing and bearing down to stop this but it has no effect on it. The episodes occur almost on a daily basis. She had previously seen Dr. Einar Gip and had worn a monitor for some period of time which apparently was unrevealing. She also reports chronic fatigue symptoms as well as myofascial pain which occurs in her bilateral upper arms and upper trapezius and lower  cervical spine areas as well as her legs. She reports episodes of paroxysmal pain, shortness of breath fatigue and rapid heart rate which could be signs of fibromyalgia.  Finally she reports palpitations that have improved with self treatment. She increased her metoprolol tartrate from once daily 50 mg to 50 mg in the morning and 25 mg at night. Then she started to increase it to 50 mg twice daily. She is currently taking this dose, but reports that in the early afternoon or early morning that her palpitations do "breakthrough".  Amanda Bender returns today for followup of her echocardiogram and monitor. The echocardiogram did demonstrate mild mitral regurgitation, grade 1 diastolic dysfunction and an EF of 65-70%. No wall motion abnormalities. Her monitor did demonstrate PVCs which were isolated and seem to come at various times throughout the day. There was no significant nonsustained ventricular tachycardia or other worrisome rhythms. She continues to complain of palpitations and shortness of breath with activities that previously did not bother her. Her course has been more weight gain recently as well. She continues to deny any chest pain.  Amanda Bender did undergo a metabolic test. This demonstrated a less than optimal effort with a decreased VO2 of 74%.  Peak heart rate was 68% predicted, and there was flattening of the heart rate and VO2 curves. There is a sharp rise in VO2 during free pedaling suggesting that her obesity is contributing to some of her shortness of breath. In addition, the flattening of her heart rate her suggest an element of medication related  chronotropic incompetence.  Unfortunately, she feels that she needs higher doses of beta blocker to suppress her PVCs. In fact she reports that her palpitations are improved on higher dose beta blocker and that it is actually improving her shortness of breath.  On followup today Amanda Bender has no new complaints. Her shortness of breath  is stable. Her leg swelling is still present but perhaps slightly better. She did not have a significant palpitations except when she does not get good rest.  I saw Amanda Bender back today in the office. She actually has numerous complaints. Her last exam was a couple of years ago. Today she is presenting with palpitations, worsening constant shortness of breath with minimal exertion, chest pressure, leg swelling, and has reported left temporal headache which is intermittent and not associated with vision changes. Her shortness of breath tends to be with minimal exertion and has been progressive over the past couple of months. Prior to this she had an upper respiratory infection and feels like she "has not completely gotten over it".  10/08/2015  Amanda Bender returns today for follow-up. She reports some mild improvement in shortness of breath. Her weight is down 5 pounds with the increase in Aldactone to 50 mg daily. She says her swelling is resolved. Her BNP was low at 80. Renal function is stable and potassium is normal. She continues to have some shortness of breath however and cough. In the past she had seen Dr. Lanny Hurst clamps. I advised her that he is no longer with with our pulmonary, but she would like a referral back to pulmonary for reevaluation. Blood pressure appears well controlled today. Her nuclear stress test was low risk with an EF of 80% and no evidence of ischemia. She still gets occasional temporal headaches which may be cluster headaches. Her sedimentation rate was low, not being suggestive of temporal arteritis.  05/07/2016  Amanda Bender returns for follow-up. She has seen Dr. Lake Bells with pulmonary in follow-up and he diagnosed her with bronchiectasis. 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 She was treated with antibiotics which she says helped her breathing, but now she is bothered by frequent incontinence. She denies any chest pain.  05/08/2017  Amanda Bender was  seen today in follow-up.  She reports of occasional shortness of breath which has been stable.  She has a history of bronchiectasis.  She denies any chest pain.  Blood pressure is well controlled 120/60.  She occasionally gets some leg swelling.  She said she had had some leg swelling last night but it improved in the morning.  She may have increased her salt intake recently inadvertently.  EKG shows sinus rhythm at 62 without ischemic changes.  04/20/2018  Amanda Bender returns today for annual follow-up.  Over the past year she is had some fatigue, particularly within the last few days.  Blood pressure was noted to be low today at 94/58.  Is not clear whether this is a trend or just an outlier today.  She denies any chest pain.  She does get some shortness of breath only with moderate exertion.  She is on 3 different blood pressure medications.  She is also noted some improvement in her swelling which is possibly attributable to spironolactone.  04/12/2019  Amanda Bender is seen today in follow-up.  Overall she seems to be doing well.  She recently had some issues with iatrogenic hyperthyroidism.  Her medications were readjusted.  She was also in the ER for acute  renal failure.  This was thought to be due to her angiotensin receptor blocker which was discontinued.  She was also off of Aldactone for a while and her creatinine had recovered back to 1.2 (it was greater than 2.0).  She is now pretty much on monotherapy Aldactone and Toprol.  She denies any palpitations.  08/23/2020  Amanda Bender is seen today in follow-up.  Overall she seems to be doing well.  She had some issues with worsening renal function requiring a discontinuation of her diuretics except for the Aldactone which she stays on at 50 mg daily.  This is mentioned above.  EKG today shows normal sinus rhythm with a right bundle branch block.  She denies any chest pain, but does seem to have persistent shortness of breath with exertion.   There has been some weight gain.  Is not clear whether this might be related to that or not.  She also has a history of bronchiectasis with prior TB exposure.  07/31/2021  Amanda Bender returns today for follow-up.  She continues to have shortness of breath issues.  I had sent her for VO2 testing which showed no cardiovascular limitation.  She was also noted to have some bronchiectasis and had recent CT scan of the sinuses which was negative yesterday.  She reports no significant improvement in her shortness of breath.  We continue to note that weight may be a factor in this however she is not as convinced.  She says she is very fatigued and is not motivated to exercise.  I encouraged her to consider Silver sneakers which would be of no cost to her or talking to her pulmonologist about whether she would qualify for pulmonary rehab to be paid for by insurance.  PMHx:  Past Medical History:  Diagnosis Date   Anemia    low iron   Anxiety    Arthritis    Bronchiectasis (HCC)    Complex renal cyst    Complication of anesthesia    slow to wake up   Cramps, extremity    Diverticulosis    Dyspnea    with exertion   Esophageal ulcer    GERD (gastroesophageal reflux disease)    Headache    migraines in the past - none since menopause   Heart murmur    History of hiatal hernia    Hypertension    Hyperthyroidism    as been treated (Graves disease) now hypothyroid   Hypothyroidism    Mitral prolapse    Pneumonia    PVC's (premature ventricular contractions)    Thyroid disease     Past Surgical History:  Procedure Laterality Date   ABDOMINAL HYSTERECTOMY     APPENDECTOMY     BUNIONECTOMY Bilateral    COLONOSCOPY WITH ESOPHAGOGASTRODUODENOSCOPY (EGD)     EYE SURGERY Left    cataract surgery with lens implant   LAPAROSCOPY FOR ECTOPIC PREGNANCY     SHOULDER ARTHROSCOPY WITH ROTATOR CUFF REPAIR AND SUBACROMIAL DECOMPRESSION Right 01/07/2018   Procedure: SHOULDER ARTHROSCOPY WITH ROTATOR  CUFF REPAIR AND SUBACROMIAL DECOMPRESSION;  Surgeon: Tania Ade, MD;  Location: [place];  Service: Orthopedics;  Laterality: Right;    FAMHx:  Family History  Problem Relation Age of Onset   Prostate cancer Maternal Grandfather    Tuberculosis Mother    Tuberculosis Brother    Mesothelioma Brother    Multiple myeloma Maternal Grandmother    Pancreatic cancer Paternal Grandmother    Prostate cancer Paternal Uncle    Prostate cancer  Paternal Uncle    Prostate cancer Cousin    Prostate cancer Cousin     SOCHx:   reports that she quit smoking about 60 years ago. Her smoking use included cigarettes. She has a 1.00 pack-year smoking history. She has never used smokeless tobacco. She reports that she does not drink alcohol and does not use drugs.  ALLERGIES:  Allergies  Allergen Reactions   Sulfa Antibiotics Hives   Cephalexin Rash   Contrast Media [Iodinated Contrast Media]    Doxycycline     Urinary incontinence   Iohexol      Code: HIVES, Desc: pt states hives w/IVP dye several yrs ago, pt premedicated w/65m benadryl 1 hr prior to scan per Dr.Stroud and pt was fine    Sulfonamide Derivatives     ROS: Pertinent items noted in HPI and remainder of comprehensive ROS otherwise negative.  HOME MEDS: Current Outpatient Medications  Medication Sig Dispense Refill   B Complex-C (B-COMPLEX WITH VITAMIN C) tablet Take 1 tablet by mouth daily.     Boric Acid Vaginal 600 MG SUPP As directed     cycloSPORINE (RESTASIS) 0.05 % ophthalmic emulsion Restasis 0.05 % eye drops in a dropperette     famotidine (PEPCID) 40 MG tablet Take 40 mg by mouth 2 (two) times daily.     fenofibrate 160 MG tablet Take 160 mg by mouth daily.     hydrOXYzine (ATARAX) 10 MG/5ML syrup Take 12.5 mg by mouth 3 (three) times daily as needed.     levothyroxine (SYNTHROID) 25 MCG tablet Take 25 mcg by mouth daily before breakfast.     MAGNESIUM PO Take 1 tablet by mouth daily.     metoprolol succinate  (TOPROL-XL) 50 MG 24 hr tablet TAKE 1 TABLET BY MOUTH EVERY DAY AFTER A MEAL 90 tablet 2   moxifloxacin (VIGAMOX) 0.5 % ophthalmic solution moxifloxacin 0.5 % eye drops  INSTILL 1 DROP RIGHT EYE FOUR TIMES DAILY     NP THYROID 60 MG tablet Take 30 mg by mouth daily before breakfast.     nystatin-triamcinolone ointment (MYCOLOG) As directed     OVER THE COUNTER MEDICATION Apply 1 application topically daily as needed (cramping). Magnesium topical cream - Magne Sport - uses for cramping     Respiratory Therapy Supplies (FLUTTER) DEVI Use as directed 1 each 0   spironolactone (ALDACTONE) 50 MG tablet TAKE 1 TABLET(50 MG) BY MOUTH DAILY 90 tablet 1   valACYclovir (VALTREX) 500 MG tablet Take 500 mg by mouth daily.     Vitamin D-Vitamin K (VITAMIN K2-VITAMIN D3 PO) Take 1 tablet by mouth daily.     No current facility-administered medications for this visit.    LABS/IMAGING: No results found for this or any previous visit (from the past 48 hour(s)). CT MAXILLOFACIAL WO CONTRAST  Result Date: 07/31/2021 CLINICAL DATA:  Maxillary/facial abscess bronchiectasis, chronic cough ? sinus source EXAM: CT MAXILLOFACIAL WITHOUT CONTRAST TECHNIQUE: Multidetector CT imaging of the maxillofacial structures was performed. Multiplanar CT image reconstructions were also generated. RADIATION DOSE REDUCTION: This exam was performed according to the departmental dose-optimization program which includes automated exposure control, adjustment of the mA and/or kV according to patient size and/or use of iterative reconstruction technique. COMPARISON:  None. FINDINGS: Osseous: No fracture or mandibular dislocation. No destructive process. Orbits: Negative. No traumatic or inflammatory finding. Sinuses: No mucosal thickening or air-fluid levels. Mastoid air cells clear. Soft tissues: Negative Limited intracranial: No significant or unexpected finding. IMPRESSION: Paranasal sinuses clear. Electronically Signed  By: Rolm Baptise  M.D.   On: 07/31/2021 10:48    VITALS: BP 125/68    Pulse 63    Ht 5' 2" (1.575 m)    Wt 187 lb (84.8 kg)    SpO2 99%    BMI 34.20 kg/m   EXAM: General appearance: alert and no distress Neck: no carotid bruit, no JVD and thyroid not enlarged, symmetric, no tenderness/mass/nodules Lungs: clear to auscultation bilaterally Heart: regular rate and rhythm, S1, S2 normal and systolic murmur: early systolic 2/6, blowing at apex Abdomen: soft, non-tender; bowel sounds normal; no masses,  no organomegaly Extremities: extremities normal, atraumatic, no cyanosis or edema Pulses: 2+ and symmetric Skin: Skin color, texture, turgor normal. 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   Ms. Rua had low risk VO2 testing not suggesting cardiopulmonary limitation.  Weight was thought to be an issue with her shortness of breath.  She also has bronchiectasis and has been working with pulmonary without significant improvement in her shortness of breath.  Exercise has been encouraged and she should look into either Silver sneakers which would be of no cost or discussed with her pulmonologist about possibility of pulmonary rehab if she would qualify.  Plan follow-up with me annually or sooner as necessary  Pixie Casino, MD, [facility], [place] Director of the [facility] Attending Cardiologist  Direct Dial: [phone]   Fax: [phone]  Website:  [email or web address] 07/31/2021, 1:54 PM

## 2021-08-06 ENCOUNTER — Other Ambulatory Visit (HOSPITAL_COMMUNITY): Payer: Medicare PPO

## 2022-01-22 ENCOUNTER — Other Ambulatory Visit: Payer: Self-pay | Admitting: Internal Medicine

## 2022-01-22 DIAGNOSIS — I10 Essential (primary) hypertension: Secondary | ICD-10-CM

## 2022-02-18 ENCOUNTER — Other Ambulatory Visit: Payer: Self-pay | Admitting: Obstetrics & Gynecology

## 2022-02-18 DIAGNOSIS — M8580 Other specified disorders of bone density and structure, unspecified site: Secondary | ICD-10-CM

## 2022-04-22 ENCOUNTER — Other Ambulatory Visit: Payer: Self-pay | Admitting: Internal Medicine

## 2022-10-24 LAB — LAB REPORT - SCANNED: EGFR: 38

## 2022-11-07 ENCOUNTER — Ambulatory Visit: Payer: Medicare PPO | Attending: Nurse Practitioner | Admitting: Nurse Practitioner

## 2022-11-07 ENCOUNTER — Encounter: Payer: Self-pay | Admitting: Nurse Practitioner

## 2022-11-07 VITALS — BP 126/66 | HR 65 | Ht 62.5 in | Wt 192.5 lb

## 2022-11-07 DIAGNOSIS — R002 Palpitations: Secondary | ICD-10-CM | POA: Diagnosis not present

## 2022-11-07 DIAGNOSIS — I2510 Atherosclerotic heart disease of native coronary artery without angina pectoris: Secondary | ICD-10-CM

## 2022-11-07 DIAGNOSIS — I10 Essential (primary) hypertension: Secondary | ICD-10-CM

## 2022-11-07 DIAGNOSIS — R0609 Other forms of dyspnea: Secondary | ICD-10-CM | POA: Diagnosis not present

## 2022-11-07 DIAGNOSIS — R011 Cardiac murmur, unspecified: Secondary | ICD-10-CM

## 2022-11-07 DIAGNOSIS — E039 Hypothyroidism, unspecified: Secondary | ICD-10-CM

## 2022-11-07 DIAGNOSIS — R600 Localized edema: Secondary | ICD-10-CM

## 2022-11-07 DIAGNOSIS — I25119 Atherosclerotic heart disease of native coronary artery with unspecified angina pectoris: Secondary | ICD-10-CM

## 2022-11-07 NOTE — Progress Notes (Signed)
Office Visit    Patient Name: Amanda Bender Date of Encounter: 11/07/2022  Primary Care Provider:  Andi Devon, MD Primary Cardiologist:  Chrystie Nose, MD  Chief Complaint    82 year old female with a history of CAD (noted on high resolution CT chest), dyspnea on exertion, cardiac murmur without significant echocardiogram findings, palpitations, RBBB, hypertension, hypothyroidism, and chronic fatigue who presents for follow-up related to dyspnea on exertion, palpitations, and hypertension.  Past Medical History    Past Medical History:  Diagnosis Date   Anemia    low iron   Anxiety    Arthritis    Bronchiectasis (HCC)    Complex renal cyst    Complication of anesthesia    slow to wake up   Cramps, extremity    Diverticulosis    Dyspnea    with exertion   Esophageal ulcer    GERD (gastroesophageal reflux disease)    Headache    migraines in the past - none since menopause   Heart murmur    History of hiatal hernia    Hypertension    Hyperthyroidism    as been treated (Graves disease) now hypothyroid   Hypothyroidism    Mitral prolapse    Pneumonia    PVC's (premature ventricular contractions)    Thyroid disease    Past Surgical History:  Procedure Laterality Date   ABDOMINAL HYSTERECTOMY     APPENDECTOMY     BUNIONECTOMY Bilateral    COLONOSCOPY WITH ESOPHAGOGASTRODUODENOSCOPY (EGD)     EYE SURGERY Left    cataract surgery with lens implant   LAPAROSCOPY FOR ECTOPIC PREGNANCY     SHOULDER ARTHROSCOPY WITH ROTATOR CUFF REPAIR AND SUBACROMIAL DECOMPRESSION Right 01/07/2018   Procedure: SHOULDER ARTHROSCOPY WITH ROTATOR CUFF REPAIR AND SUBACROMIAL DECOMPRESSION;  Surgeon: Jones Broom, MD;  Location: [facility] OR;  Service: Orthopedics;  Laterality: Right;    Allergies  Allergies  Allergen Reactions   Sulfa Antibiotics Hives   Cephalexin Rash   Contrast Media [Iodinated Contrast Media]    Doxycycline     Urinary incontinence   Iohexol       Code: HIVES, Desc: pt states hives w/IVP dye several yrs ago, pt premedicated w/50mg  benadryl 1 hr prior to scan per Dr.Stroud and pt was fine    Sulfonamide Derivatives    Furosemide Rash     Labs/Other Studies Reviewed    The following studies were reviewed today:  Cardiac Studies & Procedures     STRESS TESTS  MYOCARDIAL PERFUSION IMAGING 09/27/2015  Narrative  The left ventricular ejection fraction is hyperdynamic (>65%).  Nuclear stress EF: 80%.  The study is normal.  This is a low risk study.  There was no ST segment deviation noted during stress.  Normal pharmacologic nuclear stress test with no evidence of prior infarct or ischemia.   ECHOCARDIOGRAM  ECHOCARDIOGRAM COMPLETE 09/14/2015  Narrative *Cardiovascular Imaging at [facility] [address] [place], [place] [address] [phone]  ------------------------------------------------------------------- Transthoracic Echocardiography  Patient:    [name], [name] MR #:       [id number] Study Date: 09/14/2015 Gender:     F Age:        63 Height:     158.8 cm Weight:     85 kg BSA:        1.97 m^2 Pt. Status: Room:  ATTENDING    [name] MD [facility]     [name] MD REFERRING    [name] MD PERFORMING   [facility], Outpatient  SONOGRAPHER  Verlene Mayer, RCS  cc:  ------------------------------------------------------------------- LV EF: 65% -   70%  ------------------------------------------------------------------- History:   PMH:  Acquired from the patient and from the patient&'s chart.  Fatigue, exertional dyspnea, murmur, and bilateral lower extremity edema.  Risk factors:  Hypertension.  ------------------------------------------------------------------- Study Conclusions  - Left ventricle: The cavity size was normal. Wall thickness was normal. Systolic function was vigorous. The estimated ejection fraction was in the range of 65% to 70%. Doppler parameters  are consistent with abnormal left ventricular relaxation (grade 1 diastolic dysfunction).  Transthoracic echocardiography.  M-mode, complete 2D, spectral Doppler, and color Doppler.  Birthdate:  Patient birthdate: Jun 11, 1940.  Age:  Patient is 82 yr old.  Sex:  Gender: female. BMI: 33.7 kg/m^2.  Patient status:  Outpatient.  Study date:  Study date: 09/14/2015. Study time: 07:55 AM.  -------------------------------------------------------------------  ------------------------------------------------------------------- Left ventricle:  The cavity size was normal. Wall thickness was normal. Systolic function was vigorous. The estimated ejection fraction was in the range of 65% to 70%. Doppler parameters are consistent with abnormal left ventricular relaxation (grade 1 diastolic dysfunction).  ------------------------------------------------------------------- Aortic valve:   Structurally normal valve.   Cusp separation was normal.  Doppler:  Transvalvular velocity was within the normal range. There was no stenosis. There was no regurgitation.  ------------------------------------------------------------------- Aorta:  The aorta was normal, not dilated, and non-diseased.  ------------------------------------------------------------------- Mitral valve:   Doppler:  There was trivial regurgitation.  ------------------------------------------------------------------- Left atrium:  The atrium was normal in size.  ------------------------------------------------------------------- Right ventricle:  The cavity size was normal. Wall thickness was normal. Systolic function was normal.  ------------------------------------------------------------------- Tricuspid valve:   Structurally normal valve.   Leaflet separation was normal.  Doppler:  Transvalvular velocity was within the normal range. There was mild  regurgitation.  ------------------------------------------------------------------- Pericardium:  There was no pericardial effusion.  ------------------------------------------------------------------- Systemic veins: Inferior vena cava: The vessel was normal in size. The respirophasic diameter changes were in the normal range (>= 50%), consistent with normal central venous pressure.  ------------------------------------------------------------------- Post procedure conclusions Ascending Aorta:  - The aorta was normal, not dilated, and non-diseased.  ------------------------------------------------------------------- Measurements  Left ventricle                              Value        Reference LV ID, ED, PLAX chordal             (L)     41.1  mm     43 - 52 LV ID, ES, PLAX chordal             (L)     20.3  mm     23 - 38 LV fx shortening, PLAX chordal              51    %      >=29 LV PW thickness, ED                         11.9  mm     --------- IVS/LV PW ratio, ED                         1            <=1.3 Stroke volume, 2D  75    ml     --------- Stroke volume/bsa, 2D                       38    ml/m^2 --------- LV ejection fraction, 1-p A4C               84    %      --------- LV end-diastolic volume, 2-p                33    ml     --------- LV end-systolic volume, 2-p                 6     ml     --------- LV ejection fraction, 2-p                   82    %      --------- Stroke volume, 2-p                          27    ml     --------- LV end-diastolic volume/bsa, 2-p            17    ml/m^2 --------- LV end-systolic volume/bsa, 2-p             3     ml/m^2 --------- Stroke volume/bsa, 2-p                      13.7  ml/m^2 --------- LV e&', lateral                              4.58  cm/s   --------- LV E/e&', lateral                            11.64        --------- LV e&', medial                               5.65  cm/s   --------- LV  E/e&', medial                             9.43         --------- LV e&', average                              5.12  cm/s   --------- LV E/e&', average                            10.42        ---------  Ventricular septum                          Value        Reference IVS thickness, ED                           11.9  mm     ---------  LVOT  Value        Reference LVOT ID, S                                  19    mm     --------- LVOT area                                   2.84  cm^2   --------- LVOT peak velocity, S                       126   cm/s   --------- LVOT mean velocity, S                       77.8  cm/s   --------- LVOT VTI, S                                 26.5  cm     --------- LVOT peak gradient, S                       6     mm Hg  ---------  Aorta                                       Value        Reference Aortic root ID, ED                          26    mm     ---------  Left atrium                                 Value        Reference LA ID, A-P, ES                              29    mm     --------- LA ID/bsa, A-P                              1.47  cm/m^2 <=2.2 LA volume, S                                28    ml     --------- LA volume/bsa, S                            14.2  ml/m^2 --------- LA volume, ES, 1-p A4C                      31    ml     --------- LA volume/bsa, ES, 1-p A4C                  15.7  ml/m^2 --------- LA volume, ES, 1-p A2C  24    ml     --------- LA volume/bsa, ES, 1-p A2C                  12.2  ml/m^2 ---------  Mitral valve                                Value        Reference Mitral E-wave peak velocity                 53.3  cm/s   --------- Mitral A-wave peak velocity                 65.2  cm/s   --------- Mitral deceleration time                    187   ms     150 - 230 Mitral E/A ratio, peak                      0.8          ---------  Tricuspid valve                              Value        Reference Tricuspid maximal inflow velocity,          220   cm/s   --------- PISA  Legend: (L)  and  (H)  mark values outside specified reference range.  ------------------------------------------------------------------- Prepared and Electronically Authenticated by  Dietrich Pates, M.D. 2017-04-07T17:58:12    MONITORS  CARDIAC EVENT MONITOR 08/20/2015  Narrative Monitor shows PACs, PVCs, ventricular bigeminy, run of PSVT for which she was symptomatic.  Chrystie Nose, MD, [facility] Attending Cardiologist [facility]          Recent Labs: No results found for requested labs within last 365 days.  Recent Lipid Panel No results found for: "CHOL", "TRIG", "HDL", "CHOLHDL", "VLDL", "LDLCALC", "LDLDIRECT"  History of Present Illness    82 year old female with a history of CAD (noted on high resolution CT chest), dyspnea on exertion, cardiac murmur without significant echocardiogram findings, palpitations, RBBB, hypertension, hypothyroidism, and chronic fatigue.  She has a history of cardiac murmur since she was a child and was once diagnosed with mitral valve prolapse.  Echocardiogram in 2017 showed EF 65 to 70%, normal LV function, G1 DD, no significant valvular abnormalities. Lexiscan Myoview in 2017 was low risk, no evidence of ischemia.  She has a history of palpitations.  Cardiac monitor in 2017 revealed PACs, PVCs, bigeminy, and a brief run of PSVT.  She has had chronic bilateral lower extremity edema, chronic fatigue and dyspnea on exertion.  Cardiopulmonary exercise test in 2022 was normal.  Lower extremity venous duplex was negative for any venous insufficiency, she was ultimately diagnosed with lymphedema.  She was last seen in the office on 07/31/2021 and was stable overall from a cardiac standpoint.  She continued to note dyspnea on exertion despite management per pulmonology.  She was encouraged to increase her activity as tolerated and to consider pulmonary  rehab.  CT of the chest in 07/2021 per pulmonology showed aortic atherosclerosis, left main and three-vessel coronary artery disease.   She presents today for follow-up.  Since her last visit she been stable overall from a cardiac standpoint. She does note ongoing dyspnea on exertion.  She was unaware that her CT that was done last year revealed evidence of coronary artery disease.  She denies chest pain, she notes rare fleeting palpitations, denies any associated symptoms.  She has ongoing nonpitting dependent bilateral lower extremity edema, denies PND, orthopnea, weight gain. Other than her ongoing dyspnea on exertion, she denies any additional concerns today.   Home Medications    Current Outpatient Medications  Medication Sig Dispense Refill   Albuterol Sulfate, sensor, 108 (90 Base) MCG/ACT AEPB Inhale 2 puffs every 4 hours by inhalation route.     B Complex-C (B-COMPLEX WITH VITAMIN C) tablet Take 1 tablet by mouth daily.     cycloSPORINE (RESTASIS) 0.05 % ophthalmic emulsion Restasis 0.05 % eye drops in a dropperette     famotidine (PEPCID) 40 MG tablet Take 40 mg by mouth 2 (two) times daily.     fenofibrate 160 MG tablet Take 160 mg by mouth daily.     fluconazole (DIFLUCAN) 100 MG tablet Take 100 mg by mouth daily.     HYDROMET 5-1.5 MG/5ML syrup Take 5 mLs by mouth.     levothyroxine (SYNTHROID) 25 MCG tablet Take 25 mcg by mouth daily before breakfast.     metoprolol succinate (TOPROL-XL) 50 MG 24 hr tablet TAKE 1 TABLET BY MOUTH EVERY DAY AFTER A MEAL 90 tablet 2   NP THYROID 60 MG tablet Take 30 mg by mouth daily before breakfast.     nystatin-triamcinolone ointment (MYCOLOG) As directed     OVER THE COUNTER MEDICATION Apply 1 application topically daily as needed (cramping). Magnesium topical cream - Magne Sport - uses for cramping     Respiratory Therapy Supplies (FLUTTER) DEVI Use as directed 1 each 0   spironolactone (ALDACTONE) 50 MG tablet TAKE 1 TABLET(50 MG) BY MOUTH DAILY  90 tablet 3   traMADol (ULTRAM) 50 MG tablet Take 50 mg by mouth.     valACYclovir (VALTREX) 500 MG tablet Take 500 mg by mouth daily.     Vitamin D-Vitamin K (VITAMIN K2-VITAMIN D3 PO) Take 1 tablet by mouth daily.     Boric Acid Vaginal 600 MG SUPP As directed (Patient not taking: Reported on 11/07/2022)     hydrOXYzine (ATARAX) 10 MG/5ML syrup Take 12.5 mg by mouth 3 (three) times daily as needed. (Patient not taking: Reported on 11/07/2022)     MAGNESIUM PO Take 1 tablet by mouth daily. (Patient not taking: Reported on 11/07/2022)     moxifloxacin (VIGAMOX) 0.5 % ophthalmic solution moxifloxacin 0.5 % eye drops  INSTILL 1 DROP RIGHT EYE FOUR TIMES DAILY (Patient not taking: Reported on 11/07/2022)     No current facility-administered medications for this visit.     Review of Systems    She denies chest pain, palpitations, pnd, orthopnea, n, v, dizziness, syncope, weight gain, or early satiety. All other systems reviewed and are otherwise negative except as noted above.   Physical Exam    VS:  BP 126/66 (BP Location: Left Arm, Patient Position: Sitting, Cuff Size: Normal)   Pulse 65   Ht 5' 2.5" (1.588 m)   Wt 192 lb 8 oz (87.3 kg)   SpO2 97%   BMI 34.65 kg/m   GEN: Well nourished, well developed, in no acute distress. HEENT: normal. Neck: Supple, no JVD, carotid bruits, or masses. Cardiac: RRR, no murmurs, rubs, or gallops. No clubbing, cyanosis, edema.  Radials/DP/PT 2+ and equal bilaterally.  Respiratory:  Respirations regular and unlabored, clear to auscultation bilaterally. GI: Soft, nontender,  nondistended, BS + x 4. MS: no deformity or atrophy. Skin: warm and dry, no rash. Neuro:  Strength and sensation are intact. Psych: Normal affect.  Accessory Clinical Findings    ECG personally reviewed by me today -NSR, 65 bpm, RBBB- no acute changes.   Lab Results  Component Value Date   WBC 8.8 07/01/2021   HGB 12.5 07/01/2021   HCT 39.7 07/01/2021   MCV 80.2 07/01/2021    PLT 312.0 07/01/2021   Lab Results  Component Value Date   CREATININE 1.39 (H) 07/01/2021   BUN 35 (H) 07/01/2021   NA 140 07/01/2021   K 3.8 07/01/2021   CL 105 07/01/2021   CO2 25 07/01/2021   Lab Results  Component Value Date   ALT 21 07/23/2018   AST 24 07/23/2018   ALKPHOS 30 (L) 07/23/2018   BILITOT 0.4 07/23/2018   No results found for: "CHOL", "HDL", "LDLCALC", "LDLDIRECT", "TRIG", "CHOLHDL"  No results found for: "HGBA1C"  Assessment & Plan    1. Coronary artery disease/Coronary calcification noted on CT/dyspnea on exertion: Echo in 2017 showed EF 65 to 70%, normal LV function, G1 DD, no significant valvular abnormalities. Lexiscan Myoview in 2017 was low risk, no evidence of ischemia. Cardiopulmonary exercise test in 2022 was normal.  She had a high-resolution CT of the chest in 07/2021 per pulmonology that revealed aortic atherosclerosis, left main and three-vessel coronary artery disease.  She does have a history of contrast allergy.  Given already known presence of CAD on CT, as well as contrast allergy, and in the setting of ongoing dyspnea on exertion, through shared decision making, will pursue cardiac PET stress test for further ischemic evaluation.  She has previously declined statin therapy.  Will request most recent labs from PCP.  If LDL elevated above goal, she may need ongoing discussion with Dr. Rennis Golden regarding alternative lipid-lowering therapy. Continue metoprolol, spironolactone, fenofibrate.  Shared Decision Making/Informed Consent The risks [chest pain, shortness of breath, cardiac arrhythmias, dizziness, blood pressure fluctuations, myocardial infarction, stroke/transient ischemic attack, nausea, vomiting, allergic reaction, radiation exposure, metallic taste sensation and life-threatening complications (estimated to be 1 in 10,000)], benefits (risk stratification, diagnosing coronary artery disease, treatment guidance) and alternatives of a cardiac PET stress  test were discussed in detail with Ms. Cheney and she agrees to proceed.   2. Cardiac murmur: Prior diagnosis of mitral valve prolapse.  Most recent echo as above, no evidence of significant valvular abnormalities.  She does have ongoing dyspnea on exertion, if stress test unrevealing, could consider repeat echocardiogram.  Will defer for now.  3. Palpitations:  Cardiac monitor in 2017 revealed PACs, PVCs, bigeminy, and a brief run of PSVT.  She notes rare fleeting palpitations, denies any associated symptoms.  Stable on metoprolol.  4. Chronic lower extremity edema: Lower extremity venous duplex was negative for any venous insufficiency.  Suspected component of lymphedema.  She has stable nonpitting dependent bilateral lower extremity edema.  Continue compression, elevation.  5. Hypertension: BP well controlled. Continue current antihypertensive regimen.   6. Hypothyroidism: No recent TSH on file. Monitored and managed per PCP.   7. Disposition: Follow-up in 2 months.       Joylene Grapes, NP 11/07/2022, 12:33 PM

## 2022-11-07 NOTE — Patient Instructions (Signed)
Medication Instructions:  Your physician recommends that you continue on your current medications as directed. Please refer to the Current Medication list given to you today.  *If you need a refill on your cardiac medications before your next appointment, please call your pharmacy*   Lab Work: NONE ordered at this time of appointment     Testing/Procedures: How to Prepare for Your Cardiac PET/CT Stress Test:  1. Please do not take these medications before your test:   Medications that may interfere with the cardiac pharmacological stress agent (ex. nitrates - including erectile dysfunction medications, isosorbide mononitrate, tamulosin or beta-blockers) the day of the exam. (Erectile dysfunction medication should be held for at least 72 hrs prior to test) Theophylline containing medications for 12 hours. Dipyridamole 48 hours prior to the test. Your remaining medications may be taken with water.  2. Nothing to eat or drink, except water, 3 hours prior to arrival time.   NO caffeine/decaffeinated products, or chocolate 12 hours prior to arrival.  3. NO perfume, cologne or lotion  4. Total time is 1 to 2 hours; you may want to bring reading material for the waiting time.  5. Please report to Radiology at the [facility] Main Entrance 30 minutes early for your test.  [address] [place], [place] [address]  Diabetic Preparation:  Hold oral medications. You may take NPH and Lantus insulin. Do not take Humalog or Humulin R (Regular Insulin) the day of your test. Check blood sugars prior to leaving the house. If able to eat breakfast prior to 3 hour fasting, you may take all medications, including your insulin, Do not worry if you miss your breakfast dose of insulin - start at your next meal.  IF YOU THINK YOU MAY BE PREGNANT, OR ARE NURSING PLEASE INFORM THE TECHNOLOGIST.  In preparation for your appointment, medication and supplies will be purchased.  Appointment  availability is limited, so if you need to cancel or reschedule, please call the Radiology Department at [phone]  24 hours in advance to avoid a cancellation fee of $100.00  What to Expect After you Arrive:  Once you arrive and check in for your appointment, you will be taken to a preparation room within the Radiology Department.  A technologist or Nurse will obtain your medical history, verify that you are correctly prepped for the exam, and explain the procedure.  Afterwards,  an IV will be started in your arm and electrodes will be placed on your skin for EKG monitoring during the stress portion of the exam. Then you will be escorted to the PET/CT scanner.  There, staff will get you positioned on the scanner and obtain a blood pressure and EKG.  During the exam, you will continue to be connected to the EKG and blood pressure machines.  A small, safe amount of a radioactive tracer will be injected in your IV to obtain a series of pictures of your heart along with an injection of a stress agent.    After your Exam:  It is recommended that you eat a meal and drink a caffeinated beverage to counter act any effects of the stress agent.  Drink plenty of fluids for the remainder of the day and urinate frequently for the first couple of hours after the exam.  Your doctor will inform you of your test results within 7-10 business days.  For questions about your test or how to prepare for your test, please call: Rockwell Alexandria, Cardiac Imaging Nurse Navigator  Larey Brick, Cardiac Imaging Nurse Navigator Office: [phone]    Follow-Up: At [facility], you and your health needs are our priority.  As part of our continuing mission to provide you with exceptional heart care, we have created designated Provider Care Teams.  These Care Teams include your primary Cardiologist (physician) and Advanced Practice Providers (APPs -  Physician Assistants and Nurse Practitioners) who all work together  to provide you with the care you need, when you need it.  We recommend signing up for the patient portal called "MyChart".  Sign up information is provided on this After Visit Summary.  MyChart is used to connect with patients for Virtual Visits (Telemedicine).  Patients are able to view lab/test results, encounter notes, upcoming appointments, etc.  Non-urgent messages can be sent to your provider as well.   To learn more about what you can do with MyChart, go to [email or web address].    Your next appointment:   2 month(s)  Provider:   Bernadene Person, NP        Other Instructions

## 2022-12-01 ENCOUNTER — Telehealth (HOSPITAL_COMMUNITY): Payer: Self-pay | Admitting: *Deleted

## 2022-12-01 NOTE — Telephone Encounter (Signed)
Reaching out to patient to offer assistance regarding upcoming cardiac imaging study; pt verbalizes understanding of appt date/time, parking situation and where to check in, pre-test NPO status  and verified current allergies; name and call back number provided for further questions should they arise  Miran Kautzman RN Navigator Cardiac Imaging [facility] [phone] office [phone] cell  Patient aware to avoid caffeine 12 hours prior to her cardiac PET scan. 

## 2022-12-03 ENCOUNTER — Encounter (HOSPITAL_COMMUNITY)
Admission: RE | Admit: 2022-12-03 | Discharge: 2022-12-03 | Disposition: A | Discharge status: Home / Self Care | Payer: Medicare PPO | Source: Ambulatory Visit | Attending: Nurse Practitioner | Admitting: Nurse Practitioner

## 2022-12-03 DIAGNOSIS — I25119 Atherosclerotic heart disease of native coronary artery with unspecified angina pectoris: Secondary | ICD-10-CM | POA: Insufficient documentation

## 2022-12-03 DIAGNOSIS — I2510 Atherosclerotic heart disease of native coronary artery without angina pectoris: Secondary | ICD-10-CM | POA: Insufficient documentation

## 2022-12-03 LAB — NM PET CT CARDIAC PERFUSION MULTI W/ABSOLUTE BLOODFLOW
LV dias vol: 52 mL (ref 46–106)
MBFR: 2.08
Nuc Rest EF: 75 %
Nuc Stress EF: 83 %
Peak HR: 92 {beats}/min
Rest HR: 82 {beats}/min
Rest MBF: 1.21 ml/g/min
Rest Nuclear Isotope Dose: 22.8 mCi
Rest perfusion cavity size (mL): 52 mL
ST Depression (mm): 0 mm
Stress MBF: 2.52 ml/g/min
Stress Nuclear Isotope Dose: 22.6 mCi
Stress perfusion cavity size (mL): 58 mL
TID: 1.05

## 2022-12-03 MED ORDER — RUBIDIUM RB82 GENERATOR (RUBYFILL)
22.8200 | PACK | Freq: Once | INTRAVENOUS | Status: AC
  Administered 2022-12-03: 22.82 via INTRAVENOUS

## 2022-12-03 MED ORDER — REGADENOSON 0.4 MG/5ML IV SOLN
0.4000 mg | Freq: Once | INTRAVENOUS | Status: AC
  Administered 2022-12-03: 0.4 mg via INTRAVENOUS

## 2022-12-03 MED ORDER — REGADENOSON 0.4 MG/5ML IV SOLN
INTRAVENOUS | Status: AC
  Filled 2022-12-03: qty 5

## 2022-12-03 MED ORDER — RUBIDIUM RB82 GENERATOR (RUBYFILL)
22.6100 | PACK | Freq: Once | INTRAVENOUS | Status: AC
  Administered 2022-12-03: 22.61 via INTRAVENOUS

## 2022-12-08 ENCOUNTER — Telehealth: Payer: Self-pay

## 2022-12-08 NOTE — Telephone Encounter (Signed)
Spoke with pt. Pt was notified of stress test results. Pt will continue current medication and f/u as planned.

## 2023-01-14 ENCOUNTER — Ambulatory Visit: Payer: Medicare PPO | Admitting: Nurse Practitioner

## 2023-01-14 NOTE — Progress Notes (Deleted)
Office Visit    Patient Name: Amanda Bender Date of Encounter: 01/14/2023  Primary Care Provider:  Andi Devon, MD Primary Cardiologist:  Chrystie Nose, MD  Chief Complaint    82 year old female with a history of CAD (noted on high resolution CT chest), dyspnea on exertion, cardiac murmur without significant echocardiogram findings, palpitations, RBBB, hypertension, hypothyroidism, and chronic fatigue who presents for follow-up related to dyspnea on exertion, palpitations, and hypertension.   Past Medical History    Past Medical History:  Diagnosis Date   Anemia    low iron   Anxiety    Arthritis    Bronchiectasis (HCC)    Complex renal cyst    Complication of anesthesia    slow to wake up   Cramps, extremity    Diverticulosis    Dyspnea    with exertion   Esophageal ulcer    GERD (gastroesophageal reflux disease)    Headache    migraines in the past - none since menopause   Heart murmur    History of hiatal hernia    Hypertension    Hyperthyroidism    as been treated (Graves disease) now hypothyroid   Hypothyroidism    Mitral prolapse    Pneumonia    PVC's (premature ventricular contractions)    Thyroid disease    Past Surgical History:  Procedure Laterality Date   ABDOMINAL HYSTERECTOMY     APPENDECTOMY     BUNIONECTOMY Bilateral    COLONOSCOPY WITH ESOPHAGOGASTRODUODENOSCOPY (EGD)     EYE SURGERY Left    cataract surgery with lens implant   LAPAROSCOPY FOR ECTOPIC PREGNANCY     SHOULDER ARTHROSCOPY WITH ROTATOR CUFF REPAIR AND SUBACROMIAL DECOMPRESSION Right 01/07/2018   Procedure: SHOULDER ARTHROSCOPY WITH ROTATOR CUFF REPAIR AND SUBACROMIAL DECOMPRESSION;  Surgeon: Jones Broom, MD;  Location: [facility] OR;  Service: Orthopedics;  Laterality: Right;    Allergies  Allergies  Allergen Reactions   Sulfa Antibiotics Hives   Cephalexin Rash   Contrast Media [Iodinated Contrast Media]    Doxycycline     Urinary incontinence   Iohexol       Code: HIVES, Desc: pt states hives w/IVP dye several yrs ago, pt premedicated w/50mg  benadryl 1 hr prior to scan per Dr.Stroud and pt was fine    Sulfonamide Derivatives    Furosemide Rash     Labs/Other Studies Reviewed    The following studies were reviewed today:  Cardiac Studies & Procedures     STRESS TESTS  NM PET CT CARDIAC PERFUSION MULTI W/ABSOLUTE BLOODFLOW 12/03/2022  Narrative   LV perfusion is normal. There is no evidence of ischemia. There is no evidence of infarction.   Rest left ventricular function is normal. Rest EF: 75 %. Stress left ventricular function is normal. Stress EF: 83 %. End diastolic cavity size is normal.   Myocardial blood flow was computed to be 1.32ml/g/min at rest and 2.34ml/g/min at stress. Global myocardial blood flow reserve was 2.08 and was normal.   Coronary calcium was present on the attenuation correction CT images. Moderate coronary calcifications were present. Coronary calcifications were present in the left anterior descending artery distribution(s).   The study is normal. The study is low risk.  Electronically signed by Lennie Odor, MD ___________________________________________________________________________________________________________________  CLINICAL DATA:  This over-read does not include interpretation of cardiac or coronary anatomy or pathology. The Cardiac PET CT interpretation by the cardiologist is attached.  COMPARISON:  None Available.  FINDINGS: Cardiovascular: Scattered aortic atherosclerosis. Cardiomegaly. Left and  right coronary artery calcifications. No pericardial effusion.  Mediastinum/Nodes: No enlarged mediastinal, hilar, or axillary lymph nodes. Small hiatal hernia. Thyroid gland, trachea, and esophagus demonstrate no significant findings.  Limited lungs/Pleura: Areas of bronchiectasis in the right upper lobe, incompletely imaged although similar to prior examination (series 4, image 8). Densely  calcified, benign nodule of the right middle lobe, for which no further follow-up or characterization is required. No pleural effusion or pneumothorax.  Upper Abdomen: No acute abnormality.  Musculoskeletal: No chest wall abnormality. No acute osseous findings.  IMPRESSION: 1. Areas of bronchiectasis in the right upper lobe, incompletely imaged although similar to prior examination. No acute findings. 2. Cardiomegaly and coronary artery disease. 3. Small hiatal hernia.  Aortic Atherosclerosis (ICD10-I70.0).   Electronically Signed By: Jearld Lesch M.D. On: 12/03/2022 11:13   ECHOCARDIOGRAM  ECHOCARDIOGRAM COMPLETE 09/14/2015  Narrative *Cardiovascular Imaging at [facility] [address] [place], [place] [address] [phone]  ------------------------------------------------------------------- Transthoracic Echocardiography  Patient:    Kouture, Landin MR #:       [id number] Study Date: 09/14/2015 Gender:     F Age:        81 Height:     158.8 cm Weight:     85 kg BSA:        1.97 m^2 Pt. Status: Room:  ATTENDING    [name] MD ORDERING     [name] MD REFERRING    [name] MD PERFORMING   [facility], Outpatient SONOGRAPHER  Verlene Mayer, RCS  cc:  ------------------------------------------------------------------- LV EF: 65% -   70%  ------------------------------------------------------------------- History:   PMH:  Acquired from the patient and from the patient&'s chart.  Fatigue, exertional dyspnea, murmur, and bilateral lower extremity edema.  Risk factors:  Hypertension.  ------------------------------------------------------------------- Study Conclusions  - Left ventricle: The cavity size was normal. Wall thickness was normal. Systolic function was vigorous. The estimated ejection fraction was in the range of 65% to 70%. Doppler parameters are consistent with abnormal left ventricular relaxation (grade 1 diastolic  dysfunction).  Transthoracic echocardiography.  M-mode, complete 2D, spectral Doppler, and color Doppler.  Birthdate:  Patient birthdate: 05-23-1941.  Age:  Patient is 82 yr old.  Sex:  Gender: female. BMI: 33.7 kg/m^2.  Patient status:  Outpatient.  Study date:  Study date: 09/14/2015. Study time: 07:55 AM.  -------------------------------------------------------------------  ------------------------------------------------------------------- Left ventricle:  The cavity size was normal. Wall thickness was normal. Systolic function was vigorous. The estimated ejection fraction was in the range of 65% to 70%. Doppler parameters are consistent with abnormal left ventricular relaxation (grade 1 diastolic dysfunction).  ------------------------------------------------------------------- Aortic valve:   Structurally normal valve.   Cusp separation was normal.  Doppler:  Transvalvular velocity was within the normal range. There was no stenosis. There was no regurgitation.  ------------------------------------------------------------------- Aorta:  The aorta was normal, not dilated, and non-diseased.  ------------------------------------------------------------------- Mitral valve:   Doppler:  There was trivial regurgitation.  ------------------------------------------------------------------- Left atrium:  The atrium was normal in size.  ------------------------------------------------------------------- Right ventricle:  The cavity size was normal. Wall thickness was normal. Systolic function was normal.  ------------------------------------------------------------------- Tricuspid valve:   Structurally normal valve.   Leaflet separation was normal.  Doppler:  Transvalvular velocity was within the normal range. There was mild regurgitation.  ------------------------------------------------------------------- Pericardium:  There was no pericardial  effusion.  ------------------------------------------------------------------- Systemic veins: Inferior vena cava: The vessel was normal in size. The respirophasic diameter changes were in the normal range (>= 50%), consistent with normal central venous pressure.  -------------------------------------------------------------------  Post procedure conclusions Ascending Aorta:  - The aorta was normal, not dilated, and non-diseased.  ------------------------------------------------------------------- Measurements  Left ventricle                              Value        Reference LV ID, ED, PLAX chordal             (L)     41.1  mm     43 - 52 LV ID, ES, PLAX chordal             (L)     20.3  mm     23 - 38 LV fx shortening, PLAX chordal              51    %      >=29 LV PW thickness, ED                         11.9  mm     --------- IVS/LV PW ratio, ED                         1            <=1.3 Stroke volume, 2D                           75    ml     --------- Stroke volume/bsa, 2D                       38    ml/m^2 --------- LV ejection fraction, 1-p A4C               84    %      --------- LV end-diastolic volume, 2-p                33    ml     --------- LV end-systolic volume, 2-p                 6     ml     --------- LV ejection fraction, 2-p                   82    %      --------- Stroke volume, 2-p                          27    ml     --------- LV end-diastolic volume/bsa, 2-p            17    ml/m^2 --------- LV end-systolic volume/bsa, 2-p             3     ml/m^2 --------- Stroke volume/bsa, 2-p                      13.7  ml/m^2 --------- LV e&', lateral                              4.58  cm/s   --------- LV E/e&', lateral  11.64        --------- LV e&', medial                               5.65  cm/s   --------- LV E/e&', medial                             9.43         --------- LV e&', average                              5.12  cm/s    --------- LV E/e&', average                            10.42        ---------  Ventricular septum                          Value        Reference IVS thickness, ED                           11.9  mm     ---------  LVOT                                        Value        Reference LVOT ID, S                                  19    mm     --------- LVOT area                                   2.84  cm^2   --------- LVOT peak velocity, S                       126   cm/s   --------- LVOT mean velocity, S                       77.8  cm/s   --------- LVOT VTI, S                                 26.5  cm     --------- LVOT peak gradient, S                       6     mm Hg  ---------  Aorta                                       Value        Reference Aortic root ID, ED  26    mm     ---------  Left atrium                                 Value        Reference LA ID, A-P, ES                              29    mm     --------- LA ID/bsa, A-P                              1.47  cm/m^2 <=2.2 LA volume, S                                28    ml     --------- LA volume/bsa, S                            14.2  ml/m^2 --------- LA volume, ES, 1-p A4C                      31    ml     --------- LA volume/bsa, ES, 1-p A4C                  15.7  ml/m^2 --------- LA volume, ES, 1-p A2C                      24    ml     --------- LA volume/bsa, ES, 1-p A2C                  12.2  ml/m^2 ---------  Mitral valve                                Value        Reference Mitral E-wave peak velocity                 53.3  cm/s   --------- Mitral A-wave peak velocity                 65.2  cm/s   --------- Mitral deceleration time                    187   ms     150 - 230 Mitral E/A ratio, peak                      0.8          ---------  Tricuspid valve                             Value        Reference Tricuspid maximal inflow velocity,          220   cm/s   --------- PISA  Legend: (L)  and   (H)  mark values outside specified reference range.  ------------------------------------------------------------------- Prepared and Electronically Authenticated by  Dietrich Pates, M.D. 2017-04-07T17:58:12    MONITORS  CARDIAC EVENT MONITOR 08/06/2015  Narrative Monitor shows  PACs, PVCs, ventricular bigeminy, run of PSVT for which she was symptomatic.  Chrystie Nose, MD, [facility] Attending Cardiologist [facility]          Recent Labs: No results found for requested labs within last 365 days.  Recent Lipid Panel No results found for: "CHOL", "TRIG", "HDL", "CHOLHDL", "VLDL", "LDLCALC", "LDLDIRECT"  History of Present Illness    82 year old female with a history of CAD (noted on high resolution CT chest), dyspnea on exertion, cardiac murmur without significant echocardiogram findings, palpitations, RBBB, hypertension, hypothyroidism, and chronic fatigue.   She has a history of cardiac murmur since she was a child and was previously diagnosed with mitral valve prolapse.  Echocardiogram in 2017 showed EF 65 to 70%, normal LV function, G1 DD, no significant valvular abnormalities. Lexiscan Myoview in 2017 was low risk, no evidence of ischemia.  She also has a history of palpitations.  Cardiac monitor in 2017 revealed PACs, PVCs, bigeminy, and a brief run of PSVT.  She has had chronic bilateral lower extremity edema, chronic fatigue and dyspnea on exertion.  Cardiopulmonary exercise test in 2022 was normal.  Lower extremity venous duplex was negative for any venous insufficiency and she was ultimately diagnosed with lymphedema.  She has continued to note dyspnea on exertion despite management per pulmonology.  She was encouraged to increase her activity as tolerated and to consider pulmonary rehab.  CT of the chest in 07/2021 per pulmonology showed aortic atherosclerosis, left main and three-vessel coronary artery disease.  She was last seen in the office on 11/07/2022 and noted ongoing dyspnea  on exertion.  Cardiac PET stress test was negative for ischemia.   She presents today for follow-up.  Since her last visit she has ?Repeat echo 1. Coronary artery disease/Coronary calcification noted on CT/dyspnea on exertion: Echo in 2017 showed EF 65 to 70%, normal LV function, G1 DD, no significant valvular abnormalities. Lexiscan Myoview in 2017 was low risk, no evidence of ischemia. Cardiopulmonary exercise test in 2022 was normal.  She had a high-resolution CT of the chest in 07/2021 per pulmonology that revealed aortic atherosclerosis, left main and three-vessel coronary artery disease.  She does have a history of contrast allergy.  Given already known presence of CAD on CT, as well as contrast allergy, and in the setting of ongoing dyspnea on exertion, through shared decision making, will pursue cardiac PET stress test for further ischemic evaluation.  She has previously declined statin therapy.  Will request most recent labs from PCP.  If LDL elevated above goal, she may need ongoing discussion with Dr. Rennis Golden regarding alternative lipid-lowering therapy. Continue metoprolol, spironolactone, fenofibrate.   2. Cardiac murmur: Prior diagnosis of mitral valve prolapse.  Most recent echo as above, no evidence of significant valvular abnormalities.  She does have ongoing dyspnea on exertion, if stress test unrevealing, could consider repeat echocardiogram.  Will defer for now.   3. Palpitations:  Cardiac monitor in 2017 revealed PACs, PVCs, bigeminy, and a brief run of PSVT.  She notes rare fleeting palpitations, denies any associated symptoms.  Stable on metoprolol.   4. Chronic lower extremity edema: Lower extremity venous duplex was negative for any venous insufficiency.  Suspected component of lymphedema.  She has stable nonpitting dependent bilateral lower extremity edema.  Continue compression, elevation.   5. Hypertension: BP well controlled. Continue current antihypertensive regimen.    6.  Hypothyroidism: No recent TSH on file. Monitored and managed per PCP.    7. Disposition: Follow-up in  Home  Medications    Current Outpatient Medications  Medication Sig Dispense Refill   Albuterol Sulfate, sensor, 108 (90 Base) MCG/ACT AEPB Inhale 2 puffs every 4 hours by inhalation route.     B Complex-C (B-COMPLEX WITH VITAMIN C) tablet Take 1 tablet by mouth daily.     Boric Acid Vaginal 600 MG SUPP As directed (Patient not taking: Reported on 11/07/2022)     cycloSPORINE (RESTASIS) 0.05 % ophthalmic emulsion Restasis 0.05 % eye drops in a dropperette     famotidine (PEPCID) 40 MG tablet Take 40 mg by mouth 2 (two) times daily.     fenofibrate 160 MG tablet Take 160 mg by mouth daily.     fluconazole (DIFLUCAN) 100 MG tablet Take 100 mg by mouth daily.     HYDROMET 5-1.5 MG/5ML syrup Take 5 mLs by mouth.     hydrOXYzine (ATARAX) 10 MG/5ML syrup Take 12.5 mg by mouth 3 (three) times daily as needed. (Patient not taking: Reported on 11/07/2022)     levothyroxine (SYNTHROID) 25 MCG tablet Take 25 mcg by mouth daily before breakfast.     MAGNESIUM PO Take 1 tablet by mouth daily. (Patient not taking: Reported on 11/07/2022)     metoprolol succinate (TOPROL-XL) 50 MG 24 hr tablet TAKE 1 TABLET BY MOUTH EVERY DAY AFTER A MEAL 90 tablet 2   moxifloxacin (VIGAMOX) 0.5 % ophthalmic solution moxifloxacin 0.5 % eye drops  INSTILL 1 DROP RIGHT EYE FOUR TIMES DAILY (Patient not taking: Reported on 11/07/2022)     NP THYROID 60 MG tablet Take 30 mg by mouth daily before breakfast.     nystatin-triamcinolone ointment (MYCOLOG) As directed     OVER THE COUNTER MEDICATION Apply 1 application topically daily as needed (cramping). Magnesium topical cream - Magne Sport - uses for cramping     Respiratory Therapy Supplies (FLUTTER) DEVI Use as directed 1 each 0   spironolactone (ALDACTONE) 50 MG tablet TAKE 1 TABLET(50 MG) BY MOUTH DAILY 90 tablet 3   traMADol (ULTRAM) 50 MG tablet Take 50 mg by mouth.      valACYclovir (VALTREX) 500 MG tablet Take 500 mg by mouth daily.     Vitamin D-Vitamin K (VITAMIN K2-VITAMIN D3 PO) Take 1 tablet by mouth daily.     No current facility-administered medications for this visit.     Review of Systems    ***.  All other systems reviewed and are otherwise negative except as noted above.    Physical Exam    VS:  There were no vitals taken for this visit. , BMI There is no height or weight on file to calculate BMI.     GEN: Well nourished, well developed, in no acute distress. HEENT: normal. Neck: Supple, no JVD, carotid bruits, or masses. Cardiac: RRR, no murmurs, rubs, or gallops. No clubbing, cyanosis, edema.  Radials/DP/PT 2+ and equal bilaterally.  Respiratory:  Respirations regular and unlabored, clear to auscultation bilaterally. GI: Soft, nontender, nondistended, BS + x 4. MS: no deformity or atrophy. Skin: warm and dry, no rash. Neuro:  Strength and sensation are intact. Psych: Normal affect.  Accessory Clinical Findings    ECG personally reviewed by me today -    - no acute changes.   Lab Results  Component Value Date   WBC 8.8 07/01/2021   HGB 12.5 07/01/2021   HCT 39.7 07/01/2021   MCV 80.2 07/01/2021   PLT 312.0 07/01/2021   Lab Results  Component Value Date   CREATININE 1.39 (H)  07/01/2021   BUN 35 (H) 07/01/2021   NA 140 07/01/2021   K 3.8 07/01/2021   CL 105 07/01/2021   CO2 25 07/01/2021   Lab Results  Component Value Date   ALT 21 07/23/2018   AST 24 07/23/2018   ALKPHOS 30 (L) 07/23/2018   BILITOT 0.4 07/23/2018   No results found for: "CHOL", "HDL", "LDLCALC", "LDLDIRECT", "TRIG", "CHOLHDL"  No results found for: "HGBA1C"  Assessment & Plan    1.  ***  No BP recorded.  {Refresh Note OR Click here to enter BP  :1}***   Joylene Grapes, NP 01/14/2023, 5:47 AM

## 2023-01-17 ENCOUNTER — Other Ambulatory Visit: Payer: Self-pay | Admitting: Internal Medicine

## 2023-02-16 ENCOUNTER — Other Ambulatory Visit (HOSPITAL_COMMUNITY): Payer: Self-pay | Admitting: Obstetrics & Gynecology

## 2023-02-16 DIAGNOSIS — M8580 Other specified disorders of bone density and structure, unspecified site: Secondary | ICD-10-CM

## 2023-02-27 ENCOUNTER — Ambulatory Visit: Payer: Medicare PPO | Attending: Nurse Practitioner | Admitting: Nurse Practitioner

## 2023-02-27 ENCOUNTER — Encounter: Payer: Self-pay | Admitting: Nurse Practitioner

## 2023-02-27 VITALS — BP 118/72 | HR 70 | Ht 62.0 in | Wt 190.0 lb

## 2023-02-27 DIAGNOSIS — R002 Palpitations: Secondary | ICD-10-CM | POA: Diagnosis not present

## 2023-02-27 DIAGNOSIS — R0602 Shortness of breath: Secondary | ICD-10-CM | POA: Diagnosis not present

## 2023-02-27 DIAGNOSIS — I10 Essential (primary) hypertension: Secondary | ICD-10-CM

## 2023-02-27 DIAGNOSIS — R600 Localized edema: Secondary | ICD-10-CM

## 2023-02-27 DIAGNOSIS — I2510 Atherosclerotic heart disease of native coronary artery without angina pectoris: Secondary | ICD-10-CM | POA: Diagnosis not present

## 2023-02-27 DIAGNOSIS — R011 Cardiac murmur, unspecified: Secondary | ICD-10-CM

## 2023-02-27 DIAGNOSIS — E039 Hypothyroidism, unspecified: Secondary | ICD-10-CM

## 2023-02-27 NOTE — Progress Notes (Signed)
Office Visit    Patient Name: Amanda Bender Date of Encounter: 02/27/2023  Primary Care Provider:  Andi Devon, MD Primary Cardiologist:  Chrystie Nose, MD  Chief Complaint    82 year old female with a history of CAD (noted on high resolution CT chest), dyspnea on exertion, cardiac murmur without significant echocardiogram findings, palpitations, RBBB, hypertension, hypothyroidism, and chronic fatigue who presents for follow-up related to dyspnea on exertion, palpitations, and hypertension.  Past Medical History    Past Medical History:  Diagnosis Date   Anemia    low iron   Anxiety    Arthritis    Bronchiectasis (HCC)    Complex renal cyst    Complication of anesthesia    slow to wake up   Cramps, extremity    Diverticulosis    Dyspnea    with exertion   Esophageal ulcer    GERD (gastroesophageal reflux disease)    Headache    migraines in the past - none since menopause   Heart murmur    History of hiatal hernia    Hypertension    Hyperthyroidism    as been treated (Graves disease) now hypothyroid   Hypothyroidism    Mitral prolapse    Pneumonia    PVC's (premature ventricular contractions)    Thyroid disease    Past Surgical History:  Procedure Laterality Date   ABDOMINAL HYSTERECTOMY     APPENDECTOMY     BUNIONECTOMY Bilateral    COLONOSCOPY WITH ESOPHAGOGASTRODUODENOSCOPY (EGD)     EYE SURGERY Left    cataract surgery with lens implant   LAPAROSCOPY FOR ECTOPIC PREGNANCY     SHOULDER ARTHROSCOPY WITH ROTATOR CUFF REPAIR AND SUBACROMIAL DECOMPRESSION Right 01/07/2018   Procedure: SHOULDER ARTHROSCOPY WITH ROTATOR CUFF REPAIR AND SUBACROMIAL DECOMPRESSION;  Surgeon: Jones Broom, MD;  Location: [facility] OR;  Service: Orthopedics;  Laterality: Right;    Allergies  Allergies  Allergen Reactions   Sulfa Antibiotics Hives   Cephalexin Rash   Contrast Media [Iodinated Contrast Media]    Doxycycline     Urinary incontinence   Iohexol       Code: HIVES, Desc: pt states hives w/IVP dye several yrs ago, pt premedicated w/50mg  benadryl 1 hr prior to scan per Dr.Stroud and pt was fine    Sulfonamide Derivatives    Furosemide Rash     Labs/Other Studies Reviewed    The following studies were reviewed today:  Cardiac Studies & Procedures     STRESS TESTS  NM PET CT CARDIAC PERFUSION MULTI W/ABSOLUTE BLOODFLOW 12/03/2022  Narrative   LV perfusion is normal. There is no evidence of ischemia. There is no evidence of infarction.   Rest left ventricular function is normal. Rest EF: 75 %. Stress left ventricular function is normal. Stress EF: 83 %. End diastolic cavity size is normal.   Myocardial blood flow was computed to be 1.96ml/g/min at rest and 2.17ml/g/min at stress. Global myocardial blood flow reserve was 2.08 and was normal.   Coronary calcium was present on the attenuation correction CT images. Moderate coronary calcifications were present. Coronary calcifications were present in the left anterior descending artery distribution(s).   The study is normal. The study is low risk.  Electronically signed by Lennie Odor, MD ___________________________________________________________________________________________________________________  CLINICAL DATA:  This over-read does not include interpretation of cardiac or coronary anatomy or pathology. The Cardiac PET CT interpretation by the cardiologist is attached.  COMPARISON:  None Available.  FINDINGS: Cardiovascular: Scattered aortic atherosclerosis. Cardiomegaly. Left and right  coronary artery calcifications. No pericardial effusion.  Mediastinum/Nodes: No enlarged mediastinal, hilar, or axillary lymph nodes. Small hiatal hernia. Thyroid gland, trachea, and esophagus demonstrate no significant findings.  Limited lungs/Pleura: Areas of bronchiectasis in the right upper lobe, incompletely imaged although similar to prior examination (series 4, image 8). Densely  calcified, benign nodule of the right middle lobe, for which no further follow-up or characterization is required. No pleural effusion or pneumothorax.  Upper Abdomen: No acute abnormality.  Musculoskeletal: No chest wall abnormality. No acute osseous findings.  IMPRESSION: 1. Areas of bronchiectasis in the right upper lobe, incompletely imaged although similar to prior examination. No acute findings. 2. Cardiomegaly and coronary artery disease. 3. Small hiatal hernia.  Aortic Atherosclerosis (ICD10-I70.0).   Electronically Signed By: Jearld Lesch M.D. On: 12/03/2022 11:13   ECHOCARDIOGRAM  ECHOCARDIOGRAM COMPLETE 09/14/2015  Narrative *Cardiovascular Imaging at [facility] [address] [place], [place] [address] [phone]  ------------------------------------------------------------------- Transthoracic Echocardiography  Patient:    Bender, Amanda MR #:       [id number] Study Date: 09/14/2015 Gender:     F Age:        49 Height:     158.8 cm Weight:     85 kg BSA:        1.97 m^2 Pt. Status: Room:  ATTENDING    [name] MD ORDERING     [name] MD REFERRING    [name] MD PERFORMING   [facility], Outpatient SONOGRAPHER  Verlene Mayer, RCS  cc:  ------------------------------------------------------------------- LV EF: 65% -   70%  ------------------------------------------------------------------- History:   PMH:  Acquired from the patient and from the patient&'s chart.  Fatigue, exertional dyspnea, murmur, and bilateral lower extremity edema.  Risk factors:  Hypertension.  ------------------------------------------------------------------- Study Conclusions  - Left ventricle: The cavity size was normal. Wall thickness was normal. Systolic function was vigorous. The estimated ejection fraction was in the range of 65% to 70%. Doppler parameters are consistent with abnormal left ventricular relaxation (grade 1 diastolic  dysfunction).  Transthoracic echocardiography.  M-mode, complete 2D, spectral Doppler, and color Doppler.  Birthdate:  Patient birthdate: [birth year].  Age:  Patient is 82 yr old.  Sex:  Gender: female. BMI: 33.7 kg/m^2.  Patient status:  Outpatient.  Study date:  Study date: 09/14/2015. Study time: 07:55 AM.  -------------------------------------------------------------------  ------------------------------------------------------------------- Left ventricle:  The cavity size was normal. Wall thickness was normal. Systolic function was vigorous. The estimated ejection fraction was in the range of 65% to 70%. Doppler parameters are consistent with abnormal left ventricular relaxation (grade 1 diastolic dysfunction).  ------------------------------------------------------------------- Aortic valve:   Structurally normal valve.   Cusp separation was normal.  Doppler:  Transvalvular velocity was within the normal range. There was no stenosis. There was no regurgitation.  ------------------------------------------------------------------- Aorta:  The aorta was normal, not dilated, and non-diseased.  ------------------------------------------------------------------- Mitral valve:   Doppler:  There was trivial regurgitation.  ------------------------------------------------------------------- Left atrium:  The atrium was normal in size.  ------------------------------------------------------------------- Right ventricle:  The cavity size was normal. Wall thickness was normal. Systolic function was normal.  ------------------------------------------------------------------- Tricuspid valve:   Structurally normal valve.   Leaflet separation was normal.  Doppler:  Transvalvular velocity was within the normal range. There was mild regurgitation.  ------------------------------------------------------------------- Pericardium:  There was no pericardial  effusion.  ------------------------------------------------------------------- Systemic veins: Inferior vena cava: The vessel was normal in size. The respirophasic diameter changes were in the normal range (>= 50%), consistent with normal central venous pressure.  ------------------------------------------------------------------- Post  procedure conclusions Ascending Aorta:  - The aorta was normal, not dilated, and non-diseased.  ------------------------------------------------------------------- Measurements  Left ventricle                              Value        Reference LV ID, ED, PLAX chordal             (L)     41.1  mm     43 - 52 LV ID, ES, PLAX chordal             (L)     20.3  mm     23 - 38 LV fx shortening, PLAX chordal              51    %      >=29 LV PW thickness, ED                         11.9  mm     --------- IVS/LV PW ratio, ED                         1            <=1.3 Stroke volume, 2D                           75    ml     --------- Stroke volume/bsa, 2D                       38    ml/m^2 --------- LV ejection fraction, 1-p A4C               84    %      --------- LV end-diastolic volume, 2-p                33    ml     --------- LV end-systolic volume, 2-p                 6     ml     --------- LV ejection fraction, 2-p                   82    %      --------- Stroke volume, 2-p                          27    ml     --------- LV end-diastolic volume/bsa, 2-p            17    ml/m^2 --------- LV end-systolic volume/bsa, 2-p             3     ml/m^2 --------- Stroke volume/bsa, 2-p                      13.7  ml/m^2 --------- LV e&', lateral                              4.58  cm/s   --------- LV E/e&', lateral                            11.64        ---------  LV e&', medial                               5.65  cm/s   --------- LV E/e&', medial                             9.43         --------- LV e&', average                              5.12  cm/s    --------- LV E/e&', average                            10.42        ---------  Ventricular septum                          Value        Reference IVS thickness, ED                           11.9  mm     ---------  LVOT                                        Value        Reference LVOT ID, S                                  19    mm     --------- LVOT area                                   2.84  cm^2   --------- LVOT peak velocity, S                       126   cm/s   --------- LVOT mean velocity, S                       77.8  cm/s   --------- LVOT VTI, S                                 26.5  cm     --------- LVOT peak gradient, S                       6     mm Hg  ---------  Aorta                                       Value        Reference Aortic root ID, ED                          26    mm     ---------  Left atrium                                 Value        Reference LA ID, A-P, ES                              29    mm     --------- LA ID/bsa, A-P                              1.47  cm/m^2 <=2.2 LA volume, S                                28    ml     --------- LA volume/bsa, S                            14.2  ml/m^2 --------- LA volume, ES, 1-p A4C                      31    ml     --------- LA volume/bsa, ES, 1-p A4C                  15.7  ml/m^2 --------- LA volume, ES, 1-p A2C                      24    ml     --------- LA volume/bsa, ES, 1-p A2C                  12.2  ml/m^2 ---------  Mitral valve                                Value        Reference Mitral E-wave peak velocity                 53.3  cm/s   --------- Mitral A-wave peak velocity                 65.2  cm/s   --------- Mitral deceleration time                    187   ms     150 - 230 Mitral E/A ratio, peak                      0.8          ---------  Tricuspid valve                             Value        Reference Tricuspid maximal inflow velocity,          220   cm/s   --------- PISA  Legend: (L)  and   (H)  mark values outside specified reference range.  ------------------------------------------------------------------- Prepared and Electronically Authenticated by  Dietrich Pates, M.D. 2017-04-07T17:58:12    MONITORS  CARDIAC EVENT MONITOR 08/06/2015  Narrative Monitor shows PACs, PVCs, ventricular bigeminy, run of PSVT for which she was  symptomatic.  Chrystie Nose, MD, [facility] Attending Cardiologist [facility]          Recent Labs: No results found for requested labs within last 365 days.  Recent Lipid Panel No results found for: "CHOL", "TRIG", "HDL", "CHOLHDL", "VLDL", "LDLCALC", "LDLDIRECT"  History of Present Illness    82 year old female with a history of CAD (noted on high resolution CT chest), dyspnea on exertion, cardiac murmur without significant echocardiogram findings, palpitations, RBBB, hypertension, hypothyroidism, and chronic fatigue.  She has a history of cardiac murmur since she was a child and was once diagnosed with mitral valve prolapse.  Echocardiogram in 2017 showed EF 65 to 70%, normal LV function, G1 DD, no significant valvular abnormalities. Lexiscan Myoview in 2017 was low risk, no evidence of ischemia.  She has a history of palpitations.  Cardiac monitor in 2017 revealed PACs, PVCs, bigeminy, and a brief run of PSVT.  She has had chronic bilateral lower extremity edema, chronic fatigue and dyspnea on exertion.  Cardiopulmonary exercise test in 2022 was normal.  Lower extremity venous duplex was negative for any venous insufficiency, she was ultimately diagnosed with lymphedema.  CT of the chest in 07/2021 per pulmonology showed aortic atherosclerosis, left main and three-vessel coronary artery disease. She was last seen in the office on 11/07/2022 and noted dyspnea on exertion despite management per pulmonology.  Cardiac PET stress test showed no evidence of ischemia.  There was evidence of moderate coronary artery calcification, noncardiac findings included  small hiatal hernia.  She presents today for follow-up.  Since her last visit she been able from a cardiac standpoint.  She notes that a month ago she pinched a nerve in her back, she is undergoing physical therapy for this.  She continues to note shortness of breath with exertion, unchanged from prior visits.  She has stable mild nonpitting dependent bilateral lower extremity edema.  She denies chest pain, palpitations, PND, orthopnea, weight gain.  Home Medications    Current Outpatient Medications  Medication Sig Dispense Refill   Albuterol Sulfate, sensor, 108 (90 Base) MCG/ACT AEPB as needed.     B Complex-C (B-COMPLEX WITH VITAMIN C) tablet Take 1 tablet by mouth daily.     Boric Acid Vaginal 600 MG SUPP As directed     cycloSPORINE (RESTASIS) 0.05 % ophthalmic emulsion Restasis 0.05 % eye drops in a dropperette     famotidine (PEPCID) 40 MG tablet Take 40 mg by mouth 2 (two) times daily.     fenofibrate 160 MG tablet Take 160 mg by mouth daily.     fluconazole (DIFLUCAN) 100 MG tablet Take 100 mg by mouth daily.     HYDROMET 5-1.5 MG/5ML syrup Take 5 mLs by mouth.     hydrOXYzine (ATARAX) 10 MG/5ML syrup Take 12.5 mg by mouth 3 (three) times daily as needed.     levothyroxine (SYNTHROID) 25 MCG tablet Take 25 mcg by mouth daily before breakfast.     MAGNESIUM PO Take 1 tablet by mouth daily.     metoprolol succinate (TOPROL-XL) 50 MG 24 hr tablet TAKE 1 TABLET BY MOUTH EVERY DAY AFTER A MEAL 90 tablet 3   NP THYROID 60 MG tablet Take 30 mg by mouth daily before breakfast.     nystatin-triamcinolone ointment (MYCOLOG) As directed     OVER THE COUNTER MEDICATION Apply 1 application  topically as needed (cramping). Magnesium topical cream - Magne Sport - uses for cramping     Respiratory Therapy Supplies (FLUTTER) DEVI Use  as directed (Patient taking differently: as needed. Use as directed) 1 each 0   spironolactone (ALDACTONE) 50 MG tablet TAKE 1 TABLET(50 MG) BY MOUTH DAILY 90 tablet 3    traMADol (ULTRAM) 50 MG tablet Take 50 mg by mouth.     valACYclovir (VALTREX) 500 MG tablet Take 500 mg by mouth daily.     Vitamin D-Vitamin K (VITAMIN K2-VITAMIN D3 PO) Take 1 tablet by mouth daily.     moxifloxacin (VIGAMOX) 0.5 % ophthalmic solution moxifloxacin 0.5 % eye drops  INSTILL 1 DROP RIGHT EYE FOUR TIMES DAILY (Patient not taking: Reported on 11/07/2022)     No current facility-administered medications for this visit.     Review of Systems    She denies chest pain, palpitations, pnd, orthopnea, n, v, dizziness, syncope, weight gain, or early satiety. All other systems reviewed and are otherwise negative except as noted above.   Physical Exam    VS:  BP 118/72 (BP Location: Left Arm, Patient Position: Sitting, Cuff Size: Normal)   Pulse 70   Ht 5\' 2"  (1.575 m)   Wt 190 lb (86.2 kg)   SpO2 98%   BMI 34.75 kg/m   GEN: Well nourished, well developed, in no acute distress. HEENT: normal. Neck: Supple, no JVD, carotid bruits, or masses. Cardiac: RRR, no murmurs, rubs, or gallops. No clubbing, cyanosis, edema.  Radials/DP/PT 2+ and equal bilaterally.  Respiratory:  Respirations regular and unlabored, clear to auscultation bilaterally. GI: Soft, nontender, nondistended, BS + x 4. MS: no deformity or atrophy. Skin: warm and dry, no rash. Neuro:  Strength and sensation are intact. Psych: Normal affect.  Accessory Clinical Findings    ECG personally reviewed by me today -no EKG in office today.   Lab Results  Component Value Date   WBC 8.8 07/01/2021   HGB 12.5 07/01/2021   HCT 39.7 07/01/2021   MCV 80.2 07/01/2021   PLT 312.0 07/01/2021   Lab Results  Component Value Date   CREATININE 1.39 (H) 07/01/2021   BUN 35 (H) 07/01/2021   NA 140 07/01/2021   K 3.8 07/01/2021   CL 105 07/01/2021   CO2 25 07/01/2021   Lab Results  Component Value Date   ALT 21 07/23/2018   AST 24 07/23/2018   ALKPHOS 30 (L) 07/23/2018   BILITOT 0.4 07/23/2018   No results  found for: "CHOL", "HDL", "LDLCALC", "LDLDIRECT", "TRIG", "CHOLHDL"  No results found for: "HGBA1C"  Assessment & Plan   1. Coronary artery disease/Coronary calcification noted on CT/dyspnea on exertion: Echo in 2017 showed EF 65 to 70%, normal LV function, G1 DD, no significant valvular abnormalities. Lexiscan Myoview in 2017 was low risk, no evidence of ischemia. Cardiopulmonary exercise test in 2022 was normal.  She had a high-resolution CT of the chest in 07/2021 per pulmonology that revealed aortic atherosclerosis, left main and three-vessel coronary artery disease.  Recent cardiac PET stress test was negative for ischemia but did show moderate coronary artery calcifications.  She continues to note shortness of breath with exertion. Euvolemic and well compensated on exam. Will update echocardiogram.   If cardiac workup unrevealing, would recommend follow-up with pulmonology.  She has previously declined statin therapy. She notes she is due for her annual physical and will have her cholesterol checked at that visit.  If LDL remains elevated above goal, would recommend follow-up with Dr. Rennis Golden to discuss alternative lipid-lowering therapy.  Continue metoprolol, spironolactone, fenofibrate.  2. History of cardiac murmur: Prior diagnosis of mitral valve  prolapse.  Most recent echo as above, no evidence of significant valvular abnormalities. No murmur appreciated on exam today. She does have ongoing shortness of breath with exertion, will update echocardiogram as above. Euvolemic and well compensated on exam.  3. Palpitations: Cardiac monitor in 2017 revealed PACs, PVCs, bigeminy, and a brief run of PSVT.  She denies any recent palpitations.  Stable on metoprolol.  4. Chronic lower extremity edema: Lower extremity venous duplex was negative for any venous insufficiency.  Suspected component of lymphedema.  She has stable mild nonpitting dependent bilateral lower extremity edema. Continue compression,  elevation.  5. Hypertension: BP well controlled. Continue current antihypertensive regimen.   6. Hypothyroidism: TSH was 2.71 in 11/2022. Monitored and managed per PCP.   7. Disposition: Follow-up in 3 months.        Joylene Grapes, NP 02/27/2023, 12:33 PM

## 2023-02-27 NOTE — Patient Instructions (Signed)
Medication Instructions:  Your physician recommends that you continue on your current medications as directed. Please refer to the Current Medication list given to you today.  *If you need a refill on your cardiac medications before your next appointment, please call your pharmacy*   Lab Work: NONE ordered at this time of appointment     Testing/Procedures: Your physician has requested that you have an echocardiogram. Echocardiography is a painless test that uses sound waves to create images of your heart. It provides your doctor with information about the size and shape of your heart and how well your heart's chambers and valves are working. This procedure takes approximately one hour. There are no restrictions for this procedure. Please do NOT wear cologne, perfume, aftershave, or lotions (deodorant is allowed). Please arrive 15 minutes prior to your appointment time.    Follow-Up: At [facility], you and your health needs are our priority.  As part of our continuing mission to provide you with exceptional heart care, we have created designated Provider Care Teams.  These Care Teams include your primary Cardiologist (physician) and Advanced Practice Providers (APPs -  Physician Assistants and Nurse Practitioners) who all work together to provide you with the care you need, when you need it.  We recommend signing up for the patient portal called "MyChart".  Sign up information is provided on this After Visit Summary.  MyChart is used to connect with patients for Virtual Visits (Telemedicine).  Patients are able to view lab/test results, encounter notes, upcoming appointments, etc.  Non-urgent messages can be sent to your provider as well.   To learn more about what you can do with MyChart, go to [email or web address].    Your next appointment:   3 month(s)  Provider:   Bernadene Person, NP

## 2023-03-26 ENCOUNTER — Ambulatory Visit (HOSPITAL_COMMUNITY): Payer: Medicare PPO

## 2023-04-17 ENCOUNTER — Other Ambulatory Visit: Payer: Self-pay | Admitting: Internal Medicine

## 2023-04-17 DIAGNOSIS — I10 Essential (primary) hypertension: Secondary | ICD-10-CM

## 2023-04-20 ENCOUNTER — Ambulatory Visit (HOSPITAL_COMMUNITY): Payer: Medicare PPO | Attending: Nurse Practitioner

## 2023-04-20 DIAGNOSIS — R0602 Shortness of breath: Secondary | ICD-10-CM | POA: Diagnosis present

## 2023-04-20 LAB — ECHOCARDIOGRAM COMPLETE
Area-P 1/2: 2.83 cm2
S' Lateral: 2.7 cm

## 2023-04-21 ENCOUNTER — Telehealth: Payer: Self-pay

## 2023-04-21 NOTE — Telephone Encounter (Signed)
Spoke with pt. Pt was notified of echo results. Pt will continue her current medication and f/u as planned.  

## 2023-05-21 ENCOUNTER — Ambulatory Visit: Payer: Medicare PPO | Attending: Nurse Practitioner | Admitting: Nurse Practitioner

## 2023-05-21 ENCOUNTER — Encounter: Payer: Self-pay | Admitting: Nurse Practitioner

## 2023-05-21 VITALS — BP 122/76 | HR 67 | Ht 62.5 in | Wt 190.0 lb

## 2023-05-21 DIAGNOSIS — R002 Palpitations: Secondary | ICD-10-CM

## 2023-05-21 DIAGNOSIS — R011 Cardiac murmur, unspecified: Secondary | ICD-10-CM | POA: Diagnosis not present

## 2023-05-21 DIAGNOSIS — I2510 Atherosclerotic heart disease of native coronary artery without angina pectoris: Secondary | ICD-10-CM | POA: Diagnosis not present

## 2023-05-21 DIAGNOSIS — R600 Localized edema: Secondary | ICD-10-CM

## 2023-05-21 DIAGNOSIS — E785 Hyperlipidemia, unspecified: Secondary | ICD-10-CM

## 2023-05-21 DIAGNOSIS — R0609 Other forms of dyspnea: Secondary | ICD-10-CM | POA: Diagnosis not present

## 2023-05-21 DIAGNOSIS — E039 Hypothyroidism, unspecified: Secondary | ICD-10-CM

## 2023-05-21 DIAGNOSIS — I10 Essential (primary) hypertension: Secondary | ICD-10-CM

## 2023-05-21 NOTE — Patient Instructions (Signed)
Medication Instructions:  Your physician recommends that you continue on your current medications as directed. Please refer to the Current Medication list given to you today.  *If you need a refill on your cardiac medications before your next appointment, please call your pharmacy*   Lab Work: NONE ordered at this time of appointment   Testing/Procedures: NONE ordered at this time of appointment     Follow-Up: At [facility], you and your health needs are our priority.  As part of our continuing mission to provide you with exceptional heart care, we have created designated Provider Care Teams.  These Care Teams include your primary Cardiologist (physician) and Advanced Practice Providers (APPs -  Physician Assistants and Nurse Practitioners) who all work together to provide you with the care you need, when you need it.  We recommend signing up for the patient portal called "MyChart".  Sign up information is provided on this After Visit Summary.  MyChart is used to connect with patients for Virtual Visits (Telemedicine).  Patients are able to view lab/test results, encounter notes, upcoming appointments, etc.  Non-urgent messages can be sent to your provider as well.   To learn more about what you can do with MyChart, go to [email or web address].    Your next appointment:   6 month(s)  Provider:   Chrystie Nose, MD     Other Instructions

## 2023-05-21 NOTE — Progress Notes (Signed)
Office Visit    Patient Name: Amanda Bender Date of Encounter: 05/21/2023  Primary Care Provider:  Andi Devon, MD Primary Cardiologist:  Chrystie Nose, MD  Chief Complaint    82 year old female with a history of CAD (noted on high resolution CT chest), dyspnea on exertion, cardiac murmur without significant echocardiogram findings, palpitations, RBBB, hypertension, hypothyroidism, and chronic fatigue who presents for follow-up related to dyspnea on exertion.   Past Medical History    Past Medical History:  Diagnosis Date   Anemia    low iron   Anxiety    Arthritis    Bronchiectasis (HCC)    Complex renal cyst    Complication of anesthesia    slow to wake up   Cramps, extremity    Diverticulosis    Dyspnea    with exertion   Esophageal ulcer    GERD (gastroesophageal reflux disease)    Headache    migraines in the past - none since menopause   Heart murmur    History of hiatal hernia    Hypertension    Hyperthyroidism    as been treated (Graves disease) now hypothyroid   Hypothyroidism    Mitral prolapse    Pneumonia    PVC's (premature ventricular contractions)    Thyroid disease    Past Surgical History:  Procedure Laterality Date   ABDOMINAL HYSTERECTOMY     APPENDECTOMY     BUNIONECTOMY Bilateral    COLONOSCOPY WITH ESOPHAGOGASTRODUODENOSCOPY (EGD)     EYE SURGERY Left    cataract surgery with lens implant   LAPAROSCOPY FOR ECTOPIC PREGNANCY     SHOULDER ARTHROSCOPY WITH ROTATOR CUFF REPAIR AND SUBACROMIAL DECOMPRESSION Right 01/07/2018   Procedure: SHOULDER ARTHROSCOPY WITH ROTATOR CUFF REPAIR AND SUBACROMIAL DECOMPRESSION;  Surgeon: Jones Broom, MD;  Location: [facility] OR;  Service: Orthopedics;  Laterality: Right;    Allergies  Allergies  Allergen Reactions   Sulfa Antibiotics Hives   Cephalexin Rash   Contrast Media [Iodinated Contrast Media]    Doxycycline     Urinary incontinence   Iohexol      Code: HIVES, Desc: pt states  hives w/IVP dye several yrs ago, pt premedicated w/50mg  benadryl 1 hr prior to scan per Dr.Stroud and pt was fine    Sulfonamide Derivatives    Furosemide Rash     Labs/Other Studies Reviewed    The following studies were reviewed today:  Cardiac Studies & Procedures     STRESS TESTS  NM PET CT CARDIAC PERFUSION MULTI W/ABSOLUTE BLOODFLOW 12/03/2022  Narrative   LV perfusion is normal. There is no evidence of ischemia. There is no evidence of infarction.   Rest left ventricular function is normal. Rest EF: 75 %. Stress left ventricular function is normal. Stress EF: 83 %. End diastolic cavity size is normal.   Myocardial blood flow was computed to be 1.72ml/g/min at rest and 2.37ml/g/min at stress. Global myocardial blood flow reserve was 2.08 and was normal.   Coronary calcium was present on the attenuation correction CT images. Moderate coronary calcifications were present. Coronary calcifications were present in the left anterior descending artery distribution(s).   The study is normal. The study is low risk.  Electronically signed by Lennie Odor, MD ___________________________________________________________________________________________________________________  CLINICAL DATA:  This over-read does not include interpretation of cardiac or coronary anatomy or pathology. The Cardiac PET CT interpretation by the cardiologist is attached.  COMPARISON:  None Available.  FINDINGS: Cardiovascular: Scattered aortic atherosclerosis. Cardiomegaly. Left and right coronary artery  calcifications. No pericardial effusion.  Mediastinum/Nodes: No enlarged mediastinal, hilar, or axillary lymph nodes. Small hiatal hernia. Thyroid gland, trachea, and esophagus demonstrate no significant findings.  Limited lungs/Pleura: Areas of bronchiectasis in the right upper lobe, incompletely imaged although similar to prior examination (series 4, image 8). Densely calcified, benign nodule of the  right middle lobe, for which no further follow-up or characterization is required. No pleural effusion or pneumothorax.  Upper Abdomen: No acute abnormality.  Musculoskeletal: No chest wall abnormality. No acute osseous findings.  IMPRESSION: 1. Areas of bronchiectasis in the right upper lobe, incompletely imaged although similar to prior examination. No acute findings. 2. Cardiomegaly and coronary artery disease. 3. Small hiatal hernia.  Aortic Atherosclerosis (ICD10-I70.0).   Electronically Signed By: Jearld Lesch M.D. On: 12/03/2022 11:13  ECHOCARDIOGRAM  ECHOCARDIOGRAM COMPLETE 04/20/2023  Narrative ECHOCARDIOGRAM REPORT    Patient Name:   Amanda Bender Date of Exam: 04/20/2023 Medical Rec #:  [id number]           Height:       62.0 in Accession #:    [id number]          Weight:       190.0 lb Date of Birth:  07/09/1940           BSA:          1.870 m Patient Age:    82 years            BP:           118/72 mmHg Patient Gender: F                   HR:           61 bpm. Exam Location:  [address]  Procedure: 2D Echo, Cardiac Doppler and Color Doppler  Indications:    R06.02 SOB  History:        Patient has prior history of Echocardiogram examinations, most recent 09/14/2015. CAD, Signs/Symptoms:Shortness of Breath and Murmur; Risk Factors:Hypertension and Palpitations.  Sonographer:    Samule Ohm RDCS Referring Phys: [phone] Yvanna Vidas C Yuan Gann   Sonographer Comments: Technically difficult study due to poor echo windows. IMPRESSIONS   1. Left ventricular ejection fraction, by estimation, is 65 to 70%. Left ventricular ejection fraction by PLAX is 67 %. The left ventricle has normal function. The left ventricle has no regional wall motion abnormalities. There is mild concentric left ventricular hypertrophy. Left ventricular diastolic parameters are consistent with Grade I diastolic dysfunction (impaired relaxation). 2. Right ventricular systolic function  is normal. The right ventricular size is normal. There is normal pulmonary artery systolic pressure. 3. The mitral valve is normal in structure. Trivial mitral valve regurgitation. No evidence of mitral stenosis. 4. The aortic valve is tricuspid. Aortic valve regurgitation is not visualized. No aortic stenosis is present. 5. The inferior vena cava is normal in size with greater than 50% respiratory variability, suggesting right atrial pressure of 3 mmHg.  FINDINGS Left Ventricle: Left ventricular ejection fraction, by estimation, is 65 to 70%. Left ventricular ejection fraction by PLAX is 67 %. The left ventricle has normal function. The left ventricle has no regional wall motion abnormalities. The left ventricular internal cavity size was normal in size. There is mild concentric left ventricular hypertrophy. Left ventricular diastolic parameters are consistent with Grade I diastolic dysfunction (impaired relaxation). Normal left ventricular filling pressure.  Right Ventricle: The right ventricular size is normal. No increase in right ventricular wall thickness. Right  ventricular systolic function is normal. There is normal pulmonary artery systolic pressure. The tricuspid regurgitant velocity is 2.39 m/s, and with an assumed right atrial pressure of 3 mmHg, the estimated right ventricular systolic pressure is 25.8 mmHg.  Left Atrium: Left atrial size was normal in size.  Right Atrium: Right atrial size was normal in size.  Pericardium: Trivial pericardial effusion is present.  Mitral Valve: The mitral valve is normal in structure. Trivial mitral valve regurgitation. No evidence of mitral valve stenosis.  Tricuspid Valve: The tricuspid valve is normal in structure. Tricuspid valve regurgitation is not demonstrated. No evidence of tricuspid stenosis.  Aortic Valve: The aortic valve is tricuspid. Aortic valve regurgitation is not visualized. No aortic stenosis is present.  Pulmonic Valve: The  pulmonic valve was normal in structure. Pulmonic valve regurgitation is trivial. No evidence of pulmonic stenosis.  Aorta: The aortic root is normal in size and structure.  Venous: The inferior vena cava is normal in size with greater than 50% respiratory variability, suggesting right atrial pressure of 3 mmHg.  IAS/Shunts: No atrial level shunt detected by color flow Doppler.   LEFT VENTRICLE PLAX 2D LV EF:         Left            Diastology ventricular     LV e' medial:    15.10 cm/s ejection        LV E/e' medial:  4.5 fraction by     LV e' lateral:   9.03 cm/s PLAX is 67      LV E/e' lateral: 7.5 %. LVIDd:         4.30 cm LVIDs:         2.70 cm LV PW:         1.10 cm LV IVS:        1.10 cm LVOT diam:     2.00 cm LV SV:         80 LV SV Index:   43 LVOT Area:     3.14 cm   RIGHT VENTRICLE             IVC RV S prime:     19.40 cm/s  IVC diam: 1.60 cm TAPSE (M-mode): 1.9 cm RVSP:           25.8 mmHg  LEFT ATRIUM           Index        RIGHT ATRIUM           Index LA diam:      4.00 cm 2.14 cm/m   RA Pressure: 3.00 mmHg LA Vol (A2C): 32.7 ml 17.48 ml/m  RA Area:     12.50 cm LA Vol (A4C): 35.4 ml 18.93 ml/m  RA Volume:   32.90 ml  17.59 ml/m AORTIC VALVE LVOT Vmax:   99.80 cm/s LVOT Vmean:  70.800 cm/s LVOT VTI:    0.255 m  AORTA Ao Root diam: 3.00 cm Ao Asc diam:  3.25 cm  MITRAL VALVE               TRICUSPID VALVE MV Area (PHT): 2.83 cm    TR Peak grad:   22.8 mmHg MV Decel Time: 268 msec    TR Vmax:        239.00 cm/s MV E velocity: 67.60 cm/s  Estimated RAP:  3.00 mmHg MV A velocity: 94.90 cm/s  RVSP:           25.8 mmHg MV E/A  ratio:  0.71 SHUNTS Systemic VTI:  0.26 m Systemic Diam: 2.00 cm  Chilton Si MD Electronically signed by Chilton Si MD Signature Date/Time: 04/20/2023/2:34:17 PM    Final   MONITORS  CARDIAC EVENT MONITOR 08/06/2015  Narrative Monitor shows PACs, PVCs, ventricular bigeminy, run of PSVT for which she was  symptomatic.  Chrystie Nose, MD, [facility] Attending Cardiologist [facility]          Recent Labs: No results found for requested labs within last 365 days.  Recent Lipid Panel No results found for: "CHOL", "TRIG", "HDL", "CHOLHDL", "VLDL", "LDLCALC", "LDLDIRECT"  History of Present Illness    82 year old female with a history of CAD (noted on high resolution CT chest), dyspnea on exertion, cardiac murmur without significant echocardiogram findings, palpitations, RBBB, hypertension, hypothyroidism, and chronic fatigue.   She has a history of cardiac murmur since she was a child and was once diagnosed with mitral valve prolapse.  Echocardiogram in 2017 showed EF 65 to 70%, normal LV function, G1 DD, no significant valvular abnormalities. Lexiscan Myoview in 2017 was low risk, no evidence of ischemia.  She has a history of palpitations.  Cardiac monitor in 2017 revealed PACs, PVCs, bigeminy, and a brief run of PSVT.  She has had chronic bilateral lower extremity edema, chronic fatigue and dyspnea on exertion.  Cardiopulmonary exercise test in 2022 was normal.  Lower extremity venous duplex was negative for any venous insufficiency, she was ultimately diagnosed with lymphedema.  CT of the chest in 07/2021 per pulmonology showed aortic atherosclerosis, left main and three-vessel coronary artery disease. Cardiac PET stress test 11/2022 in the setting of ongoing dyspnea on exertion showed no evidence of ischemia.  There was evidence of moderate coronary artery calcification, noncardiac findings included small hiatal hernia.  She was last seen in the office on 02/27/2023 and noted ongoing dyspnea on exertion.  Echocardiogram in 04/2023 showed EF 65 to 70%, normal LV function, no RWMA, mild concentric LVH, G1 DD, normal RV systolic function, no significant valvular abnormalities.   She presents today for follow-up.  Since her last visit she has been stable from a cardiac standpoint.  She continues to note  dyspnea on exertion, particularly when walking.  She denies any chest pain, palpitations, dizziness, edema, PND, orthopnea, weight gain.  Overall, her symptoms have been stable.    Home Medications    Current Outpatient Medications  Medication Sig Dispense Refill   Albuterol Sulfate, sensor, 108 (90 Base) MCG/ACT AEPB as needed.     B Complex-C (B-COMPLEX WITH VITAMIN C) tablet Take 1 tablet by mouth daily.     Boric Acid Vaginal 600 MG SUPP As directed     cycloSPORINE (RESTASIS) 0.05 % ophthalmic emulsion Restasis 0.05 % eye drops in a dropperette     famotidine (PEPCID) 40 MG tablet Take 40 mg by mouth 2 (two) times daily.     fenofibrate 160 MG tablet Take 160 mg by mouth daily.     hydrOXYzine (ATARAX) 10 MG/5ML syrup Take 12.5 mg by mouth 3 (three) times daily as needed.     levothyroxine (SYNTHROID) 25 MCG tablet Take 25 mcg by mouth daily before breakfast.     MAGNESIUM PO Take 1 tablet by mouth daily.     metoprolol succinate (TOPROL-XL) 50 MG 24 hr tablet TAKE 1 TABLET BY MOUTH EVERY DAY AFTER A MEAL 90 tablet 3   moxifloxacin (VIGAMOX) 0.5 % ophthalmic solution      NP THYROID 60 MG tablet Take 30  mg by mouth daily before breakfast.     nystatin-triamcinolone ointment (MYCOLOG) As directed     OVER THE COUNTER MEDICATION Apply 1 application  topically as needed (cramping). Magnesium topical cream - Magne Sport - uses for cramping     Respiratory Therapy Supplies (FLUTTER) DEVI Use as directed (Patient taking differently: as needed. Use as directed) 1 each 0   spironolactone (ALDACTONE) 50 MG tablet TAKE 1 TABLET(50 MG) BY MOUTH DAILY 90 tablet 3   traMADol (ULTRAM) 50 MG tablet Take 50 mg by mouth.     valACYclovir (VALTREX) 500 MG tablet Take 500 mg by mouth daily.     Vitamin D-Vitamin K (VITAMIN K2-VITAMIN D3 PO) Take 1 tablet by mouth daily.     fluconazole (DIFLUCAN) 100 MG tablet Take 100 mg by mouth daily. (Patient not taking: Reported on 05/21/2023)     HYDROMET 5-1.5  MG/5ML syrup Take 5 mLs by mouth. (Patient not taking: Reported on 05/21/2023)     No current facility-administered medications for this visit.     Review of Systems    She denies chest pain, palpitations, pnd, orthopnea, n, v, dizziness, syncope, edema, weight gain, or early satiety. All other systems reviewed and are otherwise negative except as noted above.   Physical Exam    VS:  BP 122/76   Pulse 67   Ht 5' 2.5" (1.588 m)   Wt 190 lb (86.2 kg)   SpO2 96%   BMI 34.20 kg/m   GEN: Well nourished, well developed, in no acute distress. HEENT: normal. Neck: Supple, no JVD, carotid bruits, or masses. Cardiac: RRR, no murmurs, rubs, or gallops. No clubbing, cyanosis, edema.  Radials/DP/PT 2+ and equal bilaterally.  Respiratory:  Respirations regular and unlabored, clear to auscultation bilaterally. GI: Soft, nontender, nondistended, BS + x 4. MS: no deformity or atrophy. Skin: warm and dry, no rash. Neuro:  Strength and sensation are intact. Psych: Normal affect.  Accessory Clinical Findings    ECG personally reviewed by me today -    - no EKG in office today.      Lab Results  Component Value Date   WBC 8.8 07/01/2021   HGB 12.5 07/01/2021   HCT 39.7 07/01/2021   MCV 80.2 07/01/2021   PLT 312.0 07/01/2021   Lab Results  Component Value Date   CREATININE 1.39 (H) 07/01/2021   BUN 35 (H) 07/01/2021   NA 140 07/01/2021   K 3.8 07/01/2021   CL 105 07/01/2021   CO2 25 07/01/2021   Lab Results  Component Value Date   ALT 21 07/23/2018   AST 24 07/23/2018   ALKPHOS 30 (L) 07/23/2018   BILITOT 0.4 07/23/2018   No results found for: "CHOL", "HDL", "LDLCALC", "LDLDIRECT", "TRIG", "CHOLHDL"  No results found for: "HGBA1C"  Assessment & Plan    1. Coronary artery disease/dyspnea on exertion: Echo in 2017 showed EF 65 to 70%, normal LV function, G1 DD, no significant valvular abnormalities. Lexiscan Myoview in 2017 was low risk, no evidence of ischemia. Cardiopulmonary  exercise test in 2022 was normal.  She had a high-resolution CT of the chest in 07/2021 per pulmonology that revealed aortic atherosclerosis, left main and three-vessel coronary artery disease.  Recent cardiac PET stress test was negative for ischemia but did show moderate coronary artery calcifications. Echocardiogram in 04/2023 showed EF 65 to 70%, normal LV function, no RWMA, mild concentric LVH, G1 DD, normal RV systolic function, no significant valvular abnormalities. She continues to note shortness of  breath with exertion. Euvolemic and well compensated on exam.  Cardiac workup and pulmonology workup reassuring to date.  Etiology remains unclear.   It is possible that some gradual weight gain has contributed to her shortness of breath with activity.  Encouraged her to increase her activity as tolerated.  Continue metoprolol, spironolactone, fenofibrate.   2. History of cardiac murmur: Prior diagnosis of mitral valve prolapse.  Most recent echo as above, no evidence of significant valvular abnormalities. Euvolemic and well compensated on exam.   3. Palpitations: Cardiac monitor in 2017 revealed PACs, PVCs, bigeminy, and a brief run of PSVT.  She denies any recent palpitations.  Stable on metoprolol.   4. Chronic lower extremity edema: Lower extremity venous duplex was negative for any venous insufficiency.  Suspected component of lymphedema.  She has stable mild nonpitting dependent bilateral lower extremity edema. Continue compression, elevation.   5. Hypertension: BP well controlled. Continue current antihypertensive regimen.    6. Hyperlipidemia: Had labs drawn recently per PCP, will request copy.  She is hesitant to initiate statin therapy.  Ideally, with nonobstructive CAD, LDL goal less than 70.  Can revisit at follow-up.  Continue fenofibrate.  7. Hypothyroidism: TSH was 2.71 in 11/2022. Monitored and managed per PCP.    8. Disposition: Follow-up in 6 months, sooner if needed.         Joylene Grapes, NP 05/21/2023, 11:03 AM

## 2024-01-19 ENCOUNTER — Other Ambulatory Visit: Payer: Self-pay | Admitting: Internal Medicine

## 2024-04-21 ENCOUNTER — Other Ambulatory Visit: Payer: Self-pay | Admitting: Internal Medicine

## 2024-04-21 DIAGNOSIS — I10 Essential (primary) hypertension: Secondary | ICD-10-CM

## 2024-05-21 ENCOUNTER — Other Ambulatory Visit (HOSPITAL_BASED_OUTPATIENT_CLINIC_OR_DEPARTMENT_OTHER): Payer: Self-pay | Admitting: Obstetrics & Gynecology

## 2024-05-21 DIAGNOSIS — E2839 Other primary ovarian failure: Secondary | ICD-10-CM
